# Patient Record
Sex: Female | Born: 1970
Health system: Southern US, Community
[De-identification: ages and names within clinical notes are randomized; demographics above are authoritative.]

## PROBLEM LIST (undated history)

## (undated) DIAGNOSIS — I2510 Atherosclerotic heart disease of native coronary artery without angina pectoris: Secondary | ICD-10-CM

## (undated) DIAGNOSIS — R002 Palpitations: Secondary | ICD-10-CM

## (undated) DIAGNOSIS — G894 Chronic pain syndrome: Secondary | ICD-10-CM

## (undated) DIAGNOSIS — R42 Dizziness and giddiness: Secondary | ICD-10-CM

## (undated) DIAGNOSIS — M791 Myalgia, unspecified site: Secondary | ICD-10-CM

## (undated) DIAGNOSIS — J449 Chronic obstructive pulmonary disease, unspecified: Secondary | ICD-10-CM

## (undated) DIAGNOSIS — R059 Cough, unspecified: Secondary | ICD-10-CM

## (undated) DIAGNOSIS — M62830 Muscle spasm of back: Secondary | ICD-10-CM

## (undated) DIAGNOSIS — I639 Cerebral infarction, unspecified: Secondary | ICD-10-CM

## (undated) DIAGNOSIS — L659 Nonscarring hair loss, unspecified: Secondary | ICD-10-CM

## (undated) DIAGNOSIS — K589 Irritable bowel syndrome without diarrhea: Secondary | ICD-10-CM

## (undated) DIAGNOSIS — K219 Gastro-esophageal reflux disease without esophagitis: Secondary | ICD-10-CM

## (undated) DIAGNOSIS — R413 Other amnesia: Secondary | ICD-10-CM

## (undated) DIAGNOSIS — E782 Mixed hyperlipidemia: Secondary | ICD-10-CM

## (undated) DIAGNOSIS — R0989 Other specified symptoms and signs involving the circulatory and respiratory systems: Secondary | ICD-10-CM

## (undated) DIAGNOSIS — C801 Malignant (primary) neoplasm, unspecified: Secondary | ICD-10-CM

## (undated) DIAGNOSIS — I4891 Unspecified atrial fibrillation: Secondary | ICD-10-CM

## (undated) DIAGNOSIS — R05 Cough: Secondary | ICD-10-CM

## (undated) HISTORY — DX: Cough, unspecified: R05.9

## (undated) HISTORY — DX: Irritable bowel syndrome, unspecified: K58.9

## (undated) HISTORY — DX: Nonscarring hair loss, unspecified: L65.9

## (undated) HISTORY — DX: Mixed hyperlipidemia: E78.2

## (undated) HISTORY — DX: Other specified symptoms and signs involving the circulatory and respiratory systems: R09.89

## (undated) HISTORY — DX: Dizziness and giddiness: R42

## (undated) HISTORY — PX: NASAL SINUS SURGERY: SHX719

## (undated) HISTORY — DX: Atherosclerotic heart disease of native coronary artery without angina pectoris: I25.10

## (undated) HISTORY — DX: Cerebral infarction, unspecified: I63.9

## (undated) HISTORY — PX: EYE SURGERY: SHX253

## (undated) HISTORY — DX: Muscle spasm of back: M62.830

## (undated) HISTORY — DX: Gastro-esophageal reflux disease without esophagitis: K21.9

## (undated) HISTORY — DX: Myalgia, unspecified site: M79.10

## (undated) HISTORY — DX: Cough: R05

## (undated) HISTORY — DX: Chronic obstructive pulmonary disease, unspecified: J44.9

## (undated) HISTORY — DX: Unspecified atrial fibrillation: I48.91

## (undated) HISTORY — DX: Palpitations: R00.2

## (undated) HISTORY — DX: Chronic pain syndrome: G89.4

## (undated) HISTORY — DX: Other amnesia: R41.3

## (undated) HISTORY — DX: Malignant (primary) neoplasm, unspecified: C80.1

---

## 2013-06-05 ENCOUNTER — Ambulatory Visit: Payer: Self-pay | Admitting: Podiatrist

## 2013-06-19 ENCOUNTER — Ambulatory Visit (INDEPENDENT_AMBULATORY_CARE_PROVIDER_SITE_OTHER): Payer: BC Managed Care – PPO | Admitting: Podiatrist

## 2013-06-19 ENCOUNTER — Encounter: Payer: Self-pay | Admitting: Podiatrist

## 2013-06-19 VITALS — BP 108/67 | HR 68 | Resp 18

## 2013-06-19 DIAGNOSIS — M799 Soft tissue disorder, unspecified: Secondary | ICD-10-CM

## 2013-06-19 NOTE — Patient Instructions (Signed)
ANTIBACTERIAL SOAP INSTRUCTIONS     Shower as usual. Before getting out, place a drop of antibacterial liquid soap (Dial) on a wet, clean washcloth.  Gently wipe washcloth over affected area.  Afterward, rinse the area with warm water.  Blot the area dry with a soft cloth and cover with antibiotic ointment (neosporin, polysporin, bacitracin) and band aid or gauze and tape       Corns and Calluses Corns are small areas of thickened skin that usually occur on the top, sides, or tip of a toe. They contain a cone-shaped core with a point that can press on a nerve below. This causes pain. Calluses are areas of thickened skin that usually develop on hands, fingers, palms, soles of the feet, and heels. These are areas that experience frequent friction or pressure. CAUSES  Corns are usually the result of rubbing (friction) or pressure from shoes that are too tight or do not fit properly. Calluses are caused by repeated friction and pressure on the affected areas. SYMPTOMS  A hard growth on the skin.  Pain or tenderness under the skin.  Sometimes, redness and swelling.  Increased discomfort while wearing tight-fitting shoes. DIAGNOSIS  Your caregiver can usually tell what the problem is by doing a physical exam. TREATMENT  Removing the cause of the friction or pressure is usually the only treatment needed. However, sometimes medicines can be used to help soften the hardened, thickened areas. These medicines include salicylic acid plasters and 12% ammonium lactate lotion. These medicines should only be used under the direction of your caregiver. HOME CARE INSTRUCTIONS   Try to remove pressure from the affected area.  You may wear donut-shaped corn pads to protect your skin.  You may use a pumice stone or nonmetallic nail file to gently reduce the thickness of a corn.  Wear properly fitted footwear.  If you have calluses on the hands, wear gloves during activities that cause  friction.  If you have diabetes, you should regularly examine your feet. Tell your caregiver if you notice any problems with your feet. SEEK IMMEDIATE MEDICAL CARE IF:   You have increased pain, swelling, redness, or warmth in the affected area.  Your corn or callus starts to drain fluid or bleeds.  You are not getting better, even with treatment. Document Released: 10/04/2003 Document Revised: 03/22/2011 Document Reviewed: 08/25/2010 [facility] Patient Information 2014 [place], [place].

## 2013-06-19 NOTE — Progress Notes (Signed)
   Subjective:    Patient ID: Sherri Arnold, female    DOB: [birth year], 43 y.o.   MRN: [id number]  HPI " I have a place on the bottomof my 3rd toe on my left foot and has been going on for about 2 years and I have been cutting them off for about 4 years and numbness and my feet tingle.  I had one on the other foot and I cut it off with a razor blade and it didn't come back.  "       Review of Systems  Respiratory: Positive for cough.   Gastrointestinal: Positive for constipation.       Bloating    Musculoskeletal:       Joint pain  All other systems reviewed and are negative.      Objective:   Physical Exam Patient is awake, alert, and oriented x 3.  In no acute distress.  Vascular status is intact with palpable pedal pulses at 2/4 DP and PT bilateral and capillary refill time within normal limits. Neurological sensation is also intact bilaterally via Semmes Weinstein monofilament at 5/5 sites. Light touch, vibratory sensation, Achilles tendon reflex is intact. Dermatological exam generally reveals skin color, turger and texture as normal. No open lesions present.  Musculature intact with dorsiflexion, plantarflexion, inversion, eversion.  Plantar left 3rd toe at the pulp of the proximal plantar portion of the toe has a large keratotic lesion present.  Pain with pressure is noted.  Central core is present.      Assessment & Plan:  Soft tissue lesion plantar 3rd left toe  Plan:  Patient requesting excision of lesion,  I discussed chance that it may recur with or without excision. Patient wishes to proceed.  The toe was anesthetized with 2% lidocaine plain and the lesion excised with a 15 blade.  Antibiotic ointment and a dressing was applied and patient given instructions for aftercare.  She will be seen back as needed or if any problems are to arise.

## 2016-01-12 DIAGNOSIS — R1013 Epigastric pain: Secondary | ICD-10-CM | POA: Diagnosis not present

## 2016-01-12 DIAGNOSIS — K2900 Acute gastritis without bleeding: Secondary | ICD-10-CM | POA: Diagnosis not present

## 2016-01-12 DIAGNOSIS — R1012 Left upper quadrant pain: Secondary | ICD-10-CM | POA: Diagnosis not present

## 2016-01-19 DIAGNOSIS — J019 Acute sinusitis, unspecified: Secondary | ICD-10-CM | POA: Diagnosis not present

## 2016-01-19 DIAGNOSIS — R1013 Epigastric pain: Secondary | ICD-10-CM | POA: Diagnosis not present

## 2016-01-19 DIAGNOSIS — R05 Cough: Secondary | ICD-10-CM | POA: Diagnosis not present

## 2016-03-02 DIAGNOSIS — I639 Cerebral infarction, unspecified: Secondary | ICD-10-CM | POA: Diagnosis not present

## 2016-03-02 DIAGNOSIS — I771 Stricture of artery: Secondary | ICD-10-CM | POA: Diagnosis not present

## 2016-03-02 DIAGNOSIS — E785 Hyperlipidemia, unspecified: Secondary | ICD-10-CM | POA: Diagnosis not present

## 2016-03-02 DIAGNOSIS — Z85828 Personal history of other malignant neoplasm of skin: Secondary | ICD-10-CM | POA: Diagnosis not present

## 2016-03-02 DIAGNOSIS — E7800 Pure hypercholesterolemia, unspecified: Secondary | ICD-10-CM | POA: Diagnosis not present

## 2016-03-02 DIAGNOSIS — R531 Weakness: Secondary | ICD-10-CM | POA: Diagnosis not present

## 2016-03-02 DIAGNOSIS — G459 Transient cerebral ischemic attack, unspecified: Secondary | ICD-10-CM | POA: Diagnosis not present

## 2016-03-03 DIAGNOSIS — G459 Transient cerebral ischemic attack, unspecified: Secondary | ICD-10-CM | POA: Diagnosis not present

## 2016-03-03 DIAGNOSIS — R938 Abnormal findings on diagnostic imaging of other specified body structures: Secondary | ICD-10-CM | POA: Diagnosis not present

## 2016-03-26 DIAGNOSIS — R109 Unspecified abdominal pain: Secondary | ICD-10-CM | POA: Diagnosis not present

## 2016-04-27 DIAGNOSIS — R1012 Left upper quadrant pain: Secondary | ICD-10-CM | POA: Diagnosis not present

## 2016-04-27 DIAGNOSIS — H6592 Unspecified nonsuppurative otitis media, left ear: Secondary | ICD-10-CM | POA: Diagnosis not present

## 2016-04-30 DIAGNOSIS — R109 Unspecified abdominal pain: Secondary | ICD-10-CM | POA: Diagnosis not present

## 2016-04-30 DIAGNOSIS — R1012 Left upper quadrant pain: Secondary | ICD-10-CM | POA: Diagnosis not present

## 2016-05-20 DIAGNOSIS — R1012 Left upper quadrant pain: Secondary | ICD-10-CM | POA: Diagnosis not present

## 2016-05-20 DIAGNOSIS — K5900 Constipation, unspecified: Secondary | ICD-10-CM | POA: Diagnosis not present

## 2016-05-21 DIAGNOSIS — R1012 Left upper quadrant pain: Secondary | ICD-10-CM | POA: Diagnosis not present

## 2016-05-21 DIAGNOSIS — K5900 Constipation, unspecified: Secondary | ICD-10-CM | POA: Diagnosis not present

## 2016-06-01 DIAGNOSIS — E785 Hyperlipidemia, unspecified: Secondary | ICD-10-CM | POA: Diagnosis not present

## 2016-06-01 DIAGNOSIS — Z0000 Encounter for general adult medical examination without abnormal findings: Secondary | ICD-10-CM | POA: Diagnosis not present

## 2016-06-01 DIAGNOSIS — I6502 Occlusion and stenosis of left vertebral artery: Secondary | ICD-10-CM | POA: Diagnosis not present

## 2016-06-01 DIAGNOSIS — G459 Transient cerebral ischemic attack, unspecified: Secondary | ICD-10-CM | POA: Diagnosis not present

## 2016-06-09 ENCOUNTER — Other Ambulatory Visit: Payer: Self-pay | Admitting: *Deleted

## 2016-06-09 DIAGNOSIS — I6523 Occlusion and stenosis of bilateral carotid arteries: Secondary | ICD-10-CM

## 2016-06-17 ENCOUNTER — Encounter: Payer: Self-pay | Admitting: Surgery

## 2016-06-22 ENCOUNTER — Ambulatory Visit (HOSPITAL_COMMUNITY)
Admission: RE | Admit: 2016-06-22 | Discharge: 2016-06-22 | Disposition: A | Discharge status: Home / Self Care | Payer: Commercial Managed Care - HMO | Source: Ambulatory Visit | Attending: Surgery | Admitting: Surgery

## 2016-06-22 DIAGNOSIS — I6523 Occlusion and stenosis of bilateral carotid arteries: Secondary | ICD-10-CM | POA: Diagnosis not present

## 2016-06-22 LAB — VAS US CAROTID
LCCADSYS: -90 cm/s
LEFT ECA DIAS: -45 cm/s
Left CCA dist dias: -34 cm/s
Left CCA prox dias: 22 cm/s
Left CCA prox sys: 72 cm/s
Left ICA dist dias: -48 cm/s
Left ICA dist sys: -108 cm/s
Left ICA prox dias: 25 cm/s
Left ICA prox sys: 67 cm/s
RCCAPDIAS: 24 cm/s
RCCAPSYS: 72 cm/s
RIGHT CCA MID DIAS: 23 cm/s
RIGHT ECA DIAS: -37 cm/s
RIGHT VERTEBRAL DIAS: -23 cm/s
Right cca dist sys: -60 cm/s

## 2016-06-23 ENCOUNTER — Encounter: Payer: Self-pay | Admitting: Surgery

## 2016-06-23 ENCOUNTER — Ambulatory Visit (INDEPENDENT_AMBULATORY_CARE_PROVIDER_SITE_OTHER): Payer: Commercial Managed Care - HMO | Admitting: Surgery

## 2016-06-23 VITALS — BP 102/71 | HR 96 | Temp 97.6°F | Resp 18 | Ht 63.0 in | Wt 143.0 lb

## 2016-06-23 DIAGNOSIS — I714 Abdominal aortic aneurysm, without rupture, unspecified: Secondary | ICD-10-CM

## 2016-06-23 DIAGNOSIS — I6523 Occlusion and stenosis of bilateral carotid arteries: Secondary | ICD-10-CM

## 2016-06-23 DIAGNOSIS — I6509 Occlusion and stenosis of unspecified vertebral artery: Secondary | ICD-10-CM

## 2016-06-23 NOTE — Progress Notes (Signed)
[facility]ist of [facility]  Patient name: Sherri Arnold MRN: [id number] DOB: [birth year] Sex: female   REQUESTING PROVIDER:    Dr. Marcello Moores   REASON FOR CONSULT:    carotid disease  HISTORY OF PRESENT ILLNESS:   Sherri Arnold is a 46 y.o. female, who is Referred today for evaluation of her carotid disease.  In February 2018 she experienced left arm and leg weakness.  She was taken to the hospital and underwent a full workup.  This revealed a left vertebral artery occlusion at its origin.  MRI was negative for stroke.  She left the hospital AMA  She denies having any other neurologic symptoms since that time with the exception of dizziness.  She states she can turn her head left or right and she can get dizzy.  She does not have weakness in either extremity with activity.  She does not get dizzy when raising her hands over her head.  Patient is now on a statin for hypercholesterolemia.  She continues to smoke but is cut back to 6-7 per day.  She does have a significant history of cardiovascular disease in her family.  Nobody has moved past the age of 40 and most people have died in her 80s.  PAST MEDICAL HISTORY    Past Medical History:  Diagnosis Date  . A-fib ([place])   . Cancer ([place])   . COPD (chronic obstructive pulmonary disease) ([place])   . Coronary artery disease   . Cough   . Dizziness   . GERD (gastroesophageal reflux disease)   . IBS (irritable bowel syndrome)   . Memory loss   . Muscle pain   . Poor circulation   . Stroke ([place])    mini-stroke     FAMILY HISTORY   Family History  Problem Relation Age of Onset  . Heart disease Paternal Grandfather     SOCIAL HISTORY:   Social History   Social History  . Marital status: Single    Spouse name: N/A  . Number of children: N/A  . Years of education: N/A   Occupational History  . Not on file.   Social History Main Topics  . Smoking status: Current  Every Day Smoker    Packs/day: 0.25    Types: Cigarettes  . Smokeless tobacco: Never Used  . Alcohol use No  . Drug use: No  . Sexual activity: Not on file   Other Topics Concern  . Not on file   Social History Narrative  . No narrative on file    ALLERGIES:    Allergies  Allergen Reactions  . Ciprofloxacin Hcl Swelling    CURRENT MEDICATIONS:    Current Outpatient Prescriptions  Medication Sig Dispense Refill  . pantoprazole (PROTONIX) 40 MG tablet Take 40 mg by mouth 2 (two) times daily.    . rosuvastatin (CRESTOR) 10 MG tablet Take 10 mg by mouth daily.    Marland Kitchen estrogens, conjugated, (PREMARIN) 1.25 MG tablet Take 1.25 mg by mouth daily.    Marland Kitchen FLUoxetine (PROZAC) 10 MG capsule Take 10 mg by mouth daily.    Marland Kitchen omeprazole (PRILOSEC) 40 MG capsule Take 40 mg by mouth daily.     No current facility-administered medications for this visit.     REVIEW OF SYSTEMS:   [X]  denotes positive finding, [ ]  denotes negative finding Cardiac  Comments:  Chest pain or chest pressure:    Shortness of breath upon exertion: x   Short of breath when lying  flat:    Irregular heart rhythm:        Vascular    Pain in calf, thigh, or hip brought on by ambulation:    Pain in feet at night that wakes you up from your sleep:     Blood clot in your veins:    Leg swelling:  x       Pulmonary    Oxygen at home:    Productive cough:  x   Wheezing:         Neurologic    Sudden weakness in arms or legs:  x   Sudden numbness in arms or legs:  x   Sudden onset of difficulty speaking or slurred speech:    Temporary loss of vision in one eye:  x   Problems with dizziness:  x       Gastrointestinal    Blood in stool:      Vomited blood:         Genitourinary    Burning when urinating:     Blood in urine:        Psychiatric    Major depression:         Hematologic    Bleeding problems:    Problems with blood clotting too easily:        Skin    Rashes or ulcers:          Constitutional    Fever or chills:     PHYSICAL EXAM:   There were no vitals filed for this visit.  GENERAL: The patient is a well-nourished female, in no acute distress. The vital signs are documented above. CARDIAC: There is a regular rate and rhythm.  VASCULAR: No carotid bruits identified.  She does have a cardiac murmur.  Palpable left and right radial pulses, the right is more prominent. PULMONARY: Nonlabored respirations ABDOMEN: Soft and non-tender.  No pulsatile mass  MUSCULOSKELETAL: There are no major deformities or cyanosis. NEUROLOGIC: No focal weakness or paresthesias are detected. SKIN: There are no ulcers or rashes noted. PSYCHIATRIC: The patient has a normal affect.  STUDIES:   I have reviewed her MRI of the head which was negative for acute events. Her CT angiogram shows a left vertebral artery occlusion at its origin.  There is a stenosis within the right P1 segment of the PCA.  She has bilateral external carotid stenosis. Ultrasound was performed today in our office which shows 1-39 percent bilateral stenosis.  ASSESSMENT and PLAN   The patient does not have any significant extracranial carotid stenosis.  She does have a left vertebral artery occlusion, however I believe she remains asymptomatic from this.  She does not have any clinical evidence of left subclavian steal syndrome.  No vascular intervention is recommended at this time.  She needs maximal medical therapy.  I stressed the importance of taking her statin as well as smoking cessation.  She will follow-up with me in 1 year with repeat carotid duplex.   Annamarie Major, MD [facility] of [facility] [phone] Pager [phone]

## 2016-07-01 NOTE — Addendum Note (Signed)
Addended by: Lianne Cure A on: 07/01/2016 10:12 AM   Modules accepted: Orders

## 2016-07-27 DIAGNOSIS — I493 Ventricular premature depolarization: Secondary | ICD-10-CM

## 2016-07-27 DIAGNOSIS — R079 Chest pain, unspecified: Secondary | ICD-10-CM

## 2016-07-27 HISTORY — DX: Chest pain, unspecified: R07.9

## 2016-07-27 HISTORY — DX: Ventricular premature depolarization: I49.3

## 2016-08-04 ENCOUNTER — Ambulatory Visit: Payer: Commercial Managed Care - HMO | Admitting: Cardiology

## 2016-09-17 DIAGNOSIS — K625 Hemorrhage of anus and rectum: Secondary | ICD-10-CM | POA: Diagnosis not present

## 2016-09-17 DIAGNOSIS — K581 Irritable bowel syndrome with constipation: Secondary | ICD-10-CM | POA: Diagnosis not present

## 2016-09-17 DIAGNOSIS — R1012 Left upper quadrant pain: Secondary | ICD-10-CM | POA: Diagnosis not present

## 2016-11-05 DIAGNOSIS — Z800 Family history of malignant neoplasm of digestive organs: Secondary | ICD-10-CM | POA: Diagnosis not present

## 2016-11-05 DIAGNOSIS — R1012 Left upper quadrant pain: Secondary | ICD-10-CM | POA: Diagnosis not present

## 2016-11-05 DIAGNOSIS — R1310 Dysphagia, unspecified: Secondary | ICD-10-CM | POA: Diagnosis not present

## 2016-11-05 DIAGNOSIS — K2900 Acute gastritis without bleeding: Secondary | ICD-10-CM | POA: Diagnosis not present

## 2016-11-05 DIAGNOSIS — K641 Second degree hemorrhoids: Secondary | ICD-10-CM | POA: Diagnosis not present

## 2016-11-05 DIAGNOSIS — D124 Benign neoplasm of descending colon: Secondary | ICD-10-CM | POA: Diagnosis not present

## 2016-11-05 DIAGNOSIS — R1013 Epigastric pain: Secondary | ICD-10-CM | POA: Diagnosis not present

## 2016-11-05 DIAGNOSIS — K625 Hemorrhage of anus and rectum: Secondary | ICD-10-CM | POA: Diagnosis not present

## 2016-12-16 DIAGNOSIS — R109 Unspecified abdominal pain: Secondary | ICD-10-CM | POA: Diagnosis not present

## 2016-12-20 DIAGNOSIS — R05 Cough: Secondary | ICD-10-CM | POA: Diagnosis not present

## 2016-12-20 DIAGNOSIS — B349 Viral infection, unspecified: Secondary | ICD-10-CM | POA: Diagnosis not present

## 2017-01-19 DIAGNOSIS — R52 Pain, unspecified: Secondary | ICD-10-CM | POA: Diagnosis not present

## 2017-01-21 DIAGNOSIS — G459 Transient cerebral ischemic attack, unspecified: Secondary | ICD-10-CM | POA: Diagnosis not present

## 2017-01-21 DIAGNOSIS — E785 Hyperlipidemia, unspecified: Secondary | ICD-10-CM | POA: Diagnosis not present

## 2017-01-21 DIAGNOSIS — Z79899 Other long term (current) drug therapy: Secondary | ICD-10-CM | POA: Diagnosis not present

## 2017-01-21 DIAGNOSIS — I6502 Occlusion and stenosis of left vertebral artery: Secondary | ICD-10-CM | POA: Diagnosis not present

## 2017-02-01 ENCOUNTER — Ambulatory Visit: Payer: 59 | Admitting: Allergy

## 2017-02-01 ENCOUNTER — Encounter: Payer: Self-pay | Admitting: Allergy

## 2017-02-01 ENCOUNTER — Other Ambulatory Visit: Payer: Self-pay

## 2017-02-01 VITALS — BP 112/74 | HR 84 | Temp 98.9°F | Resp 18 | Ht 63.0 in | Wt 144.8 lb

## 2017-02-01 DIAGNOSIS — J4540 Moderate persistent asthma, uncomplicated: Secondary | ICD-10-CM

## 2017-02-01 DIAGNOSIS — J309 Allergic rhinitis, unspecified: Secondary | ICD-10-CM

## 2017-02-01 DIAGNOSIS — T7819XA Other adverse food reactions, not elsewhere classified, initial encounter: Secondary | ICD-10-CM

## 2017-02-01 DIAGNOSIS — H1010 Acute atopic conjunctivitis, unspecified eye: Secondary | ICD-10-CM

## 2017-02-01 DIAGNOSIS — T781XXA Other adverse food reactions, not elsewhere classified, initial encounter: Secondary | ICD-10-CM

## 2017-02-01 MED ORDER — FLUTICASONE FUROATE 100 MCG/ACT IN AEPB: 1.0000 | INHALATION_SPRAY | Freq: Every day | RESPIRATORY_TRACT | 3 refills | Status: DC

## 2017-02-01 MED ORDER — ALBUTEROL SULFATE (2.5 MG/3ML) 0.083% IN NEBU: 2.5000 mg | INHALATION_SOLUTION | RESPIRATORY_TRACT | 1 refills | Status: DC | PRN

## 2017-02-01 MED ORDER — OLOPATADINE HCL 0.2 % OP SOLN: 1.0000 [drp] | OPHTHALMIC | 5 refills | Status: DC

## 2017-02-01 MED ORDER — IPRATROPIUM-ALBUTEROL 0.5-2.5 (3) MG/3ML IN SOLN: 3.0000 mL | Freq: Four times a day (QID) | RESPIRATORY_TRACT | Status: AC

## 2017-02-01 NOTE — Patient Instructions (Addendum)
Allergies    - environmental allergy skin testing today is positive to ragweed, dust mites and dog    - allergen avoidance measures discussed/provided today    - recommend using ZyrtecD only during illnesses like sinus infections.      - for daily allergy symptoms control try Xyzal 5mg  daily    - for nasal congestion use OTC Flonase, Nasacort or Rhinocort 2 sprays each nostril daily.  Use for 1-2 weeks at a time before stopping once symptoms improved.      - for itchy/watery/red eyes use Pataday 1 drop each eye as needed daily    - allergen immunotherapy (allergy shots) is a potential treatment option for allergy symptoms control and protocol, benefits and risks discussed today.  Will need your breathing to be under good control prior to starting allergy shots if you would like to proceed with this option.    Breathing issues    - lung function is reduced but did improve greatly after albuterol use by nebulizer    - for maintenance of symptoms recommend using Arnuity 1 puff daily    - have access to albuterol inhaler 2 puffs every 4-6 hours as needed for cough/wheeze/shortness of breath/chest tightness.  May use 15-20 minutes prior to activity.   Monitor frequency of use.    Control goals:   Full participation in all desired activities (may need albuterol before activity)  Albuterol use two time or less a week on average (not counting use with activity)  Cough interfering with sleep two time or less a month  Oral steroids no more than once a year  No hospitalizations  Adverse food reaction    -  Skin testing to 10 common food allergen is negative.  Thus you do not have IgE mediated food allergy however you may have intolerances.  Follow-up 3-4 months or sooner if needed

## 2017-02-01 NOTE — Progress Notes (Signed)
New Patient Note  RE: Sherri Arnold MRN: [id number] DOB: [birth year] Date of Office Visit: 02/01/2017  Referring provider: Melony Overly, MD Primary care provider: Melony Overly, MD  Chief Complaint: Allergies and breathing difficulties  History of present illness: Sherri Arnold is a 47 y.o. female presenting today for consultation for allergies and breathing difficulties.    She states she has sinus and throat issues.  She has nasal congestion, sinus pressure through her cheeks, HA, throat clearing, sneezing and itchy/watery eyes.   Symptoms are worse during winter. She states in the summertime her symptoms are non-existence and she does not need to take any medications during the summer.  She takes zyrtecD which she states works well for symptom control.  She has been on zyrtecD for couple years now.  She has taken plain zyrtec and states does not help.  She has tried Afrin for her nasal congestion.  She has never used any eye drops.  She has had allergy testing done via blood work that she recalls being allergic to dust.  She reports sinus infections with need for antibiotic about 2 times a year.     She has ventolin inhaler that she has had now for the past year.  She states she was prescribed albuterol due to having a "chest cold" a year or so ago.  She reports using albuterol about once month.  She also has a nebulizer that she uses about once a month as well.    She will use albuterol for shortness of breath, wheezing and chest tightness.  Albuterol does help to relieve symptoms.  She reports last prednisone use was about couple weeks ago due to sinus and breathing issues.  She denies any nighttime awakenings.  She denies any hospitalizations.    She also has stomach issues.  She reports after she eats she gets left sided pain, bloating and gas as well a nausea without emesis.  She also states she has developed a itchy rash on her stomach and back with the abdominal  symptoms.  She has not been able to identify any particular foods that trigger these symptoms.  Symptoms can occur about once a week.  Symptoms ongoing now since Dec 2017.  She has had colonoscopy that was negative.    No history of eczema.    Review of systems: Review of Systems  Constitutional: Negative for chills, fever and malaise/fatigue.  HENT: Positive for congestion and sinus pain. Negative for ear discharge, ear pain, nosebleeds and sore throat.   Eyes: Positive for redness. Negative for pain and discharge.  Respiratory: Positive for cough, shortness of breath and wheezing.   Cardiovascular: Negative for chest pain.  Gastrointestinal: Positive for abdominal pain and nausea. Negative for constipation, diarrhea, heartburn and vomiting.  Musculoskeletal: Negative for joint pain and myalgias.  Skin: Negative for itching and rash.  Neurological: Positive for headaches. Negative for dizziness.    All other systems negative unless noted above in HPI  Past medical history: Past Medical History:  Diagnosis Date  . A-fib ([place])   . Cancer ([place])   . COPD (chronic obstructive pulmonary disease) ([place])   . Coronary artery disease   . Cough   . Dizziness   . GERD (gastroesophageal reflux disease)   . IBS (irritable bowel syndrome)   . Memory loss   . Muscle pain   . Poor circulation   . Stroke [facility])    mini-stroke    Past surgical history: Past  Surgical History:  Procedure Laterality Date  . EYE SURGERY    . NASAL SINUS SURGERY      Family history:  Family History  Problem Relation Age of Onset  . Heart disease Paternal Grandfather   . COPD Mother   . Diabetes Maternal Grandmother   . Diabetes Paternal Grandmother     Social history: She lives in a home without carpeting with electric heating and central cooling.  There are no pets in the home.  There is no concern for water damage, mildew or roaches in the home.  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 Tobacco Use  .  Smoking status: Current Every Day Smoker    Packs/day: 0.25    Years: 33.00    Pack years: 8.25    Types: Cigarettes  . Smokeless tobacco: Never Used    Medication List: Allergies as of 02/01/2017      Reactions   Ciprofloxacin Hcl Swelling      Medication List        Accurate as of 02/01/17 12:31 PM. Always use your most recent med list.          cetirizine-pseudoephedrine 5-120 MG tablet Commonly known as:  ZYRTEC-D Take 1 tablet by mouth 2 (two) times daily.   estrogens (conjugated) 1.25 MG tablet Commonly known as:  PREMARIN Take 1.25 mg by mouth daily.   GOODSENSE ASPIRIN 325 MG tablet Generic drug:  aspirin Take 325 mg by mouth daily.   rosuvastatin 10 MG tablet Commonly known as:  CRESTOR Take 10 mg by mouth daily.   VENTOLIN HFA 108 (90 Base) MCG/ACT inhaler Generic drug:  albuterol       Known medication allergies: Allergies  Allergen Reactions  . Ciprofloxacin Hcl Swelling     Physical examination: Blood pressure 112/74, pulse 84, temperature 98.9 F (37.2 C), temperature source Oral, resp. rate 18, height 5\' 3"  (1.6 m), weight 144 lb 12.8 oz (65.7 kg).  General: Alert, interactive, in no acute distress. HEENT: PERRLA, TMs pearly gray, turbinates moderately edematous with clear discharge, post-pharynx non erythematous. Neck: Supple without lymphadenopathy. Lungs: Mildly decreased breath sounds bilaterally without wheezing, rhonchi or rales. {no increased work of breathing.  Improved aeration status post DuoNeb CV: Normal S1, S2 without murmurs. Abdomen: Nondistended, nontender. Skin: Warm and dry, without lesions or rashes. Extremities:  No clubbing, cyanosis or edema. Neuro:   Grossly intact.  Diagnositics/Labs:  Spirometry: FEV1: 1.34L 48%, FVC: 2.65L  76%.  After DuoNeb she had a significant increase of 26% in FEV1 to 1.69 L or 61% of predicted.  Allergy testing: Environmental allergy skin prick testing is positive to short and giant  ragweed and dust mites Intradermal testing is positive to dog Allergy testing results were read and interpreted by provider, documented by clinical staff.   Assessment and plan:   Allergic rhinoconjunctivitis    - environmental allergy skin testing today is positive to ragweed, dust mites and dog    - allergen avoidance measures discussed/provided today    - recommend using ZyrtecD only during illnesses like sinus infections.      - for daily allergy symptom control try Xyzal 5mg  daily    - for nasal congestion use OTC Flonase, Nasacort or Rhinocort 2 sprays each nostril daily.  Use for 1-2 weeks at a time before stopping once symptoms improved.      - for itchy/watery/red eyes use Pataday 1 drop each eye as needed daily    - allergen immunotherapy (allergy shots) is  a potential treatment option for allergy symptoms control and protocol, benefits and risks discussed today.  Will need your breathing to be under good control prior to starting allergy shots if you would like to proceed with this option.    Asthma, moderate persistent    - lung function is reduced but did improve greatly after albuterol use    - for maintenance of symptoms recommend using Arnuity 1 puff daily    - have access to albuterol inhaler 2 puffs every 4-6 hours as needed for cough/wheeze/shortness of breath/chest tightness.  May use 15-20 minutes prior to activity.   Monitor frequency of use.    Control goals:   Full participation in all desired activities (may need albuterol before activity)  Albuterol use two time or less a week on average (not counting use with activity)  Cough interfering with sleep two time or less a month  Oral steroids no more than once a year  No hospitalizations  Adverse food reaction    -  Skin testing to 10 common food allergens is negative.  Thus you do not have IgE mediated food allergy however you may have intolerances.  I have advised her if she is able to identify any particular  foods to remove them from the diet and see if her symptoms improve.  Follow-up 3-4 months or sooner if needed   I appreciate the opportunity to take part in Sima's care. Please do not hesitate to contact me with questions.  Sincerely,   Prudy Feeler, MD Allergy/Immunology [facility] [place]

## 2017-02-03 ENCOUNTER — Telehealth: Payer: Self-pay | Admitting: Surgery

## 2017-02-03 NOTE — Telephone Encounter (Signed)
Anderson Malta from [facility] left a message to see if we had sched this pt from the referral they had sent. There is no current referral in the system to [place] for this pt.  However, [facility] sent [place] a referral back in June 2018. We saw the pt then and the pt will return to the office June 2019 for follow up.  Called Anderson Malta back at [phone] option 2 and left her a message giving her that information.

## 2017-02-10 DIAGNOSIS — J029 Acute pharyngitis, unspecified: Secondary | ICD-10-CM | POA: Diagnosis not present

## 2017-02-10 DIAGNOSIS — N3001 Acute cystitis with hematuria: Secondary | ICD-10-CM | POA: Diagnosis not present

## 2017-02-10 DIAGNOSIS — R05 Cough: Secondary | ICD-10-CM | POA: Diagnosis not present

## 2017-02-11 ENCOUNTER — Other Ambulatory Visit: Payer: Self-pay | Admitting: *Deleted

## 2017-02-11 MED ORDER — OLOPATADINE HCL 0.1 % OP SOLN: 1.0000 [drp] | Freq: Two times a day (BID) | OPHTHALMIC | 4 refills | Status: DC

## 2017-03-24 DIAGNOSIS — I6523 Occlusion and stenosis of bilateral carotid arteries: Secondary | ICD-10-CM

## 2017-03-24 DIAGNOSIS — I6509 Occlusion and stenosis of unspecified vertebral artery: Secondary | ICD-10-CM

## 2017-03-24 DIAGNOSIS — I708 Atherosclerosis of other arteries: Secondary | ICD-10-CM | POA: Insufficient documentation

## 2017-03-24 HISTORY — DX: Occlusion and stenosis of unspecified vertebral artery: I65.09

## 2017-03-24 HISTORY — DX: Atherosclerosis of other arteries: I70.8

## 2017-03-24 HISTORY — DX: Occlusion and stenosis of bilateral carotid arteries: I65.23

## 2017-04-12 DIAGNOSIS — R202 Paresthesia of skin: Secondary | ICD-10-CM | POA: Diagnosis not present

## 2017-04-12 DIAGNOSIS — R42 Dizziness and giddiness: Secondary | ICD-10-CM | POA: Diagnosis not present

## 2017-04-12 DIAGNOSIS — R079 Chest pain, unspecified: Secondary | ICD-10-CM | POA: Diagnosis not present

## 2017-04-12 DIAGNOSIS — R51 Headache: Secondary | ICD-10-CM | POA: Diagnosis not present

## 2017-04-12 DIAGNOSIS — R200 Anesthesia of skin: Secondary | ICD-10-CM | POA: Diagnosis not present

## 2017-04-12 DIAGNOSIS — G459 Transient cerebral ischemic attack, unspecified: Secondary | ICD-10-CM | POA: Diagnosis not present

## 2017-04-13 DIAGNOSIS — R079 Chest pain, unspecified: Secondary | ICD-10-CM | POA: Diagnosis not present

## 2017-04-15 DIAGNOSIS — M542 Cervicalgia: Secondary | ICD-10-CM | POA: Diagnosis not present

## 2017-04-15 DIAGNOSIS — M25511 Pain in right shoulder: Secondary | ICD-10-CM | POA: Diagnosis not present

## 2017-04-19 DIAGNOSIS — G459 Transient cerebral ischemic attack, unspecified: Secondary | ICD-10-CM | POA: Diagnosis not present

## 2017-04-19 DIAGNOSIS — Z87891 Personal history of nicotine dependence: Secondary | ICD-10-CM | POA: Diagnosis not present

## 2017-04-19 DIAGNOSIS — J309 Allergic rhinitis, unspecified: Secondary | ICD-10-CM | POA: Diagnosis not present

## 2017-04-19 DIAGNOSIS — Z0001 Encounter for general adult medical examination with abnormal findings: Secondary | ICD-10-CM | POA: Diagnosis not present

## 2017-04-19 DIAGNOSIS — G478 Other sleep disorders: Secondary | ICD-10-CM | POA: Diagnosis not present

## 2017-04-19 DIAGNOSIS — E785 Hyperlipidemia, unspecified: Secondary | ICD-10-CM | POA: Diagnosis not present

## 2017-04-19 DIAGNOSIS — R319 Hematuria, unspecified: Secondary | ICD-10-CM | POA: Diagnosis not present

## 2017-05-20 DIAGNOSIS — E559 Vitamin D deficiency, unspecified: Secondary | ICD-10-CM | POA: Diagnosis not present

## 2017-05-20 DIAGNOSIS — J4540 Moderate persistent asthma, uncomplicated: Secondary | ICD-10-CM | POA: Diagnosis not present

## 2017-05-20 DIAGNOSIS — G4733 Obstructive sleep apnea (adult) (pediatric): Secondary | ICD-10-CM | POA: Diagnosis not present

## 2017-05-20 DIAGNOSIS — F17210 Nicotine dependence, cigarettes, uncomplicated: Secondary | ICD-10-CM | POA: Diagnosis not present

## 2017-05-31 ENCOUNTER — Ambulatory Visit: Payer: 59 | Admitting: Allergy

## 2017-06-10 DIAGNOSIS — R1013 Epigastric pain: Secondary | ICD-10-CM | POA: Diagnosis not present

## 2017-06-10 DIAGNOSIS — S93492S Sprain of other ligament of left ankle, sequela: Secondary | ICD-10-CM | POA: Diagnosis not present

## 2017-06-17 ENCOUNTER — Other Ambulatory Visit: Payer: Self-pay | Admitting: Orthopedic Surgery

## 2017-06-17 DIAGNOSIS — S93492A Sprain of other ligament of left ankle, initial encounter: Secondary | ICD-10-CM

## 2017-06-20 ENCOUNTER — Other Ambulatory Visit: Payer: Commercial Managed Care - HMO

## 2017-06-20 ENCOUNTER — Inpatient Hospital Stay
Admission: RE | Admit: 2017-06-20 | Discharge: 2017-06-20 | Disposition: A | Discharge status: Home / Self Care | Payer: Commercial Managed Care - HMO | Source: Ambulatory Visit | Attending: Orthopedic Surgery | Admitting: Orthopedic Surgery

## 2017-06-27 ENCOUNTER — Other Ambulatory Visit (HOSPITAL_COMMUNITY): Payer: Commercial Managed Care - HMO

## 2017-06-27 ENCOUNTER — Ambulatory Visit: Payer: Commercial Managed Care - HMO | Admitting: Surgery

## 2017-06-27 ENCOUNTER — Encounter (HOSPITAL_COMMUNITY): Payer: Commercial Managed Care - HMO

## 2017-06-29 DIAGNOSIS — G4733 Obstructive sleep apnea (adult) (pediatric): Secondary | ICD-10-CM | POA: Diagnosis not present

## 2017-07-04 ENCOUNTER — Ambulatory Visit
Admission: RE | Admit: 2017-07-04 | Discharge: 2017-07-04 | Disposition: A | Discharge status: Home / Self Care | Payer: Worker's Compensation | Source: Ambulatory Visit | Attending: Orthopedic Surgery | Admitting: Orthopedic Surgery

## 2017-07-04 DIAGNOSIS — S93492A Sprain of other ligament of left ankle, initial encounter: Secondary | ICD-10-CM

## 2017-07-12 DIAGNOSIS — J301 Allergic rhinitis due to pollen: Secondary | ICD-10-CM | POA: Diagnosis not present

## 2017-07-12 DIAGNOSIS — R5383 Other fatigue: Secondary | ICD-10-CM | POA: Diagnosis not present

## 2017-07-12 DIAGNOSIS — G4733 Obstructive sleep apnea (adult) (pediatric): Secondary | ICD-10-CM | POA: Diagnosis not present

## 2017-07-12 DIAGNOSIS — F17210 Nicotine dependence, cigarettes, uncomplicated: Secondary | ICD-10-CM | POA: Diagnosis not present

## 2017-07-12 DIAGNOSIS — J4540 Moderate persistent asthma, uncomplicated: Secondary | ICD-10-CM | POA: Diagnosis not present

## 2017-08-19 DIAGNOSIS — J018 Other acute sinusitis: Secondary | ICD-10-CM | POA: Diagnosis not present

## 2017-08-28 DIAGNOSIS — H9209 Otalgia, unspecified ear: Secondary | ICD-10-CM | POA: Diagnosis not present

## 2017-08-28 DIAGNOSIS — M542 Cervicalgia: Secondary | ICD-10-CM | POA: Diagnosis not present

## 2017-08-28 DIAGNOSIS — M25511 Pain in right shoulder: Secondary | ICD-10-CM | POA: Diagnosis not present

## 2017-10-07 DIAGNOSIS — M542 Cervicalgia: Secondary | ICD-10-CM | POA: Diagnosis not present

## 2017-10-07 DIAGNOSIS — R51 Headache: Secondary | ICD-10-CM | POA: Diagnosis not present

## 2017-10-12 DIAGNOSIS — M5412 Radiculopathy, cervical region: Secondary | ICD-10-CM | POA: Diagnosis not present

## 2017-10-12 DIAGNOSIS — G5601 Carpal tunnel syndrome, right upper limb: Secondary | ICD-10-CM | POA: Diagnosis not present

## 2017-10-19 DIAGNOSIS — R091 Pleurisy: Secondary | ICD-10-CM | POA: Diagnosis not present

## 2017-10-19 DIAGNOSIS — J209 Acute bronchitis, unspecified: Secondary | ICD-10-CM | POA: Diagnosis not present

## 2017-10-19 DIAGNOSIS — J0100 Acute maxillary sinusitis, unspecified: Secondary | ICD-10-CM | POA: Diagnosis not present

## 2017-10-25 DIAGNOSIS — J209 Acute bronchitis, unspecified: Secondary | ICD-10-CM | POA: Diagnosis not present

## 2017-10-25 DIAGNOSIS — M50122 Cervical disc disorder at C5-C6 level with radiculopathy: Secondary | ICD-10-CM | POA: Diagnosis not present

## 2017-10-26 DIAGNOSIS — K1120 Sialoadenitis, unspecified: Secondary | ICD-10-CM | POA: Diagnosis not present

## 2017-11-08 DIAGNOSIS — R51 Headache: Secondary | ICD-10-CM | POA: Diagnosis not present

## 2017-11-08 DIAGNOSIS — M50122 Cervical disc disorder at C5-C6 level with radiculopathy: Secondary | ICD-10-CM | POA: Diagnosis not present

## 2017-11-17 DIAGNOSIS — M5412 Radiculopathy, cervical region: Secondary | ICD-10-CM | POA: Diagnosis not present

## 2017-11-17 DIAGNOSIS — R0602 Shortness of breath: Secondary | ICD-10-CM | POA: Diagnosis not present

## 2017-11-24 DIAGNOSIS — M5031 Other cervical disc degeneration,  high cervical region: Secondary | ICD-10-CM | POA: Diagnosis not present

## 2017-11-24 DIAGNOSIS — G44209 Tension-type headache, unspecified, not intractable: Secondary | ICD-10-CM | POA: Diagnosis not present

## 2017-11-24 DIAGNOSIS — R51 Headache: Secondary | ICD-10-CM | POA: Diagnosis not present

## 2017-11-24 DIAGNOSIS — S161XXA Strain of muscle, fascia and tendon at neck level, initial encounter: Secondary | ICD-10-CM | POA: Diagnosis not present

## 2017-11-24 DIAGNOSIS — M542 Cervicalgia: Secondary | ICD-10-CM | POA: Diagnosis not present

## 2017-12-01 DIAGNOSIS — M50122 Cervical disc disorder at C5-C6 level with radiculopathy: Secondary | ICD-10-CM | POA: Diagnosis not present

## 2017-12-01 DIAGNOSIS — M542 Cervicalgia: Secondary | ICD-10-CM | POA: Diagnosis not present

## 2017-12-05 DIAGNOSIS — M50221 Other cervical disc displacement at C4-C5 level: Secondary | ICD-10-CM | POA: Diagnosis not present

## 2017-12-05 DIAGNOSIS — M5011 Cervical disc disorder with radiculopathy,  high cervical region: Secondary | ICD-10-CM | POA: Diagnosis not present

## 2017-12-05 DIAGNOSIS — M4802 Spinal stenosis, cervical region: Secondary | ICD-10-CM | POA: Diagnosis not present

## 2017-12-21 DIAGNOSIS — R202 Paresthesia of skin: Secondary | ICD-10-CM | POA: Diagnosis not present

## 2017-12-21 DIAGNOSIS — R531 Weakness: Secondary | ICD-10-CM | POA: Diagnosis not present

## 2017-12-30 DIAGNOSIS — M47812 Spondylosis without myelopathy or radiculopathy, cervical region: Secondary | ICD-10-CM | POA: Diagnosis not present

## 2017-12-30 DIAGNOSIS — R51 Headache: Secondary | ICD-10-CM | POA: Diagnosis not present

## 2017-12-30 DIAGNOSIS — J439 Emphysema, unspecified: Secondary | ICD-10-CM | POA: Diagnosis not present

## 2017-12-30 DIAGNOSIS — L040 Acute lymphadenitis of face, head and neck: Secondary | ICD-10-CM | POA: Diagnosis not present

## 2017-12-30 DIAGNOSIS — R221 Localized swelling, mass and lump, neck: Secondary | ICD-10-CM | POA: Diagnosis not present

## 2018-02-23 ENCOUNTER — Ambulatory Visit (INDEPENDENT_AMBULATORY_CARE_PROVIDER_SITE_OTHER): Payer: Worker's Compensation | Admitting: Sports Medicine

## 2018-02-23 ENCOUNTER — Encounter: Payer: Self-pay | Admitting: Sports Medicine

## 2018-02-23 ENCOUNTER — Ambulatory Visit: Payer: Self-pay

## 2018-02-23 ENCOUNTER — Other Ambulatory Visit: Payer: Self-pay

## 2018-02-23 DIAGNOSIS — M25572 Pain in left ankle and joints of left foot: Secondary | ICD-10-CM

## 2018-02-23 DIAGNOSIS — S93432A Sprain of tibiofibular ligament of left ankle, initial encounter: Secondary | ICD-10-CM

## 2018-02-23 DIAGNOSIS — S99912A Unspecified injury of left ankle, initial encounter: Secondary | ICD-10-CM

## 2018-02-23 NOTE — Progress Notes (Addendum)
Subjective:  LEVAEH VICE is a 48 y.o. female patient who presents to office for evaluation of left ankle pain. Patient complains of continued pain in the ankle after a injury that happened while at work on April 16, 2017 states that she had her heel caught in a hole and fell down states that every since she went to urgent care and then was referred to orthopedics and saw Dr. Lorin Mercy Dr. Lorin Mercy did a MRI that identified a ligament tear and advised patient that it would get better on its own however because it did not patient referred herself to [facility] and was seen by the doctor there who recommended physical therapy due to her continued nerve tingling and burning and pain to the extremity a nerve study was done where she said that it was identified that she has nerve damage.  Patient states the only thing that has helped is her cam boot. Patient has tried also Voltaren gel with no relief in symptoms. Patient denies any other pedal complaints.  Review of Systems  Musculoskeletal: Positive for joint pain.  Neurological: Positive for sensory change.  All other systems reviewed and are negative.    Patient Active Problem List   Diagnosis Date Noted  . Bilateral carotid artery stenosis 03/24/2017  . Left subclavian artery occlusion 03/24/2017  . Occlusion of vertebral artery 03/24/2017  . Chest pain 07/27/2016  . Ventricular premature beats 07/27/2016    Current Outpatient Medications on File Prior to Visit  Medication Sig Dispense Refill  . albuterol (PROVENTIL) (2.5 MG/3ML) 0.083% nebulizer solution Take 3 mLs (2.5 mg total) by nebulization every 4 (four) hours as needed for wheezing or shortness of breath. 75 mL 1  . aspirin (GOODSENSE ASPIRIN) 325 MG tablet Take 325 mg by mouth daily.    . cetirizine-pseudoephedrine (ZYRTEC-D) 5-120 MG tablet Take 1 tablet by mouth 2 (two) times daily.     Marland Kitchen estrogens, conjugated, (PREMARIN) 1.25 MG tablet Take 1.25 mg by mouth daily.    . Fluticasone  Furoate (ARNUITY ELLIPTA) 100 MCG/ACT AEPB Inhale 1 Dose into the lungs daily. Rinse, gargle, and spit after use. 1 each 3  . olopatadine (PATANOL) 0.1 % ophthalmic solution Place 1 drop into both eyes 2 (two) times daily. 5 mL 4  . rosuvastatin (CRESTOR) 10 MG tablet Take 10 mg by mouth daily.    . VENTOLIN HFA 108 (90 Base) MCG/ACT inhaler      Current Facility-Administered Medications on File Prior to Visit  Medication Dose Route Frequency Provider Last Rate Last Dose  . ipratropium-albuterol (DUONEB) 0.5-2.5 (3) MG/3ML nebulizer solution 3 mL  3 mL Nebulization Q6H Padgett, Rae Halsted, MD        Allergies  Allergen Reactions  . Ciprofloxacin Hcl Swelling  . Propofol Other (See Comments)    Objective:  General: Alert and oriented x3 in no acute distress  Dermatology: No open lesions bilateral lower extremities, no webspace macerations, no ecchymosis bilateral, all nails x 10 are well manicured.  Vascular: Dorsalis Pedis and Posterior Tibial pedal pulses faintly palpable, Capillary Fill Time 3 seconds,(+) pedal hair growth bilateral, no edema bilateral lower extremities, Temperature gradient within normal limits.  Neurology: Gross sensation intact via light touch bilateral, however there is subjective burning pain sharp shooting positive the low side on left over the dorsal cutaneous nerve.  Musculoskeletal: Mild tenderness with palpation at lateral ankle and tib-fib syndesmosis on left. Negative talar tilt, mild pain with tib-fib stress, mild left ankle instability on left.  No pain with calf compression bilateral. Range of motion within normal limits with mild guarding on left ankle. Strength within normal limits in all groups bilateral.   Gait: Antalgic gait  Xrays  Left Ankle   Impression: No acute findings.  Assessment and Plan: Problem List Items Addressed This Visit    None    Visit Diagnoses    Injury of left ankle, initial encounter    -  Primary   Relevant  Orders   DG Ankle Complete Left   Sprain of tibiofibular ligament of left ankle, initial encounter       Left ankle pain, unspecified chronicity           -Complete examination performed -Prior to patient appointment extensively spent time reviewing over 30 pages of faxed information from previous doctor visit MRI and progress reports sent and patient referral packet as this is a work comp case.  My time spent reviewing the charts were very extensive in preparation for patient visit. -Xrays were obtained today in office and reviewed -Discussed treatement options for continued pain after injury at work with suspected high ankle sprain/ligament tear -Rx repeat MRI for further evaluation of extent of injury since last MRI was over a year old with still very painful symptoms out of proportion -Advised patient to continue with Lyrica -Recommend to continue with rest elevation topical pain creams and rubs and cam boot -Patient to remain out of work until after her MRIs performed to prevent any further injury -Patient to return to office after MRI or sooner if condition worsens.  Landis Martins, DPM

## 2018-02-24 ENCOUNTER — Telehealth: Payer: Self-pay | Admitting: *Deleted

## 2018-02-24 DIAGNOSIS — M25572 Pain in left ankle and joints of left foot: Secondary | ICD-10-CM

## 2018-02-24 DIAGNOSIS — S93432A Sprain of tibiofibular ligament of left ankle, initial encounter: Secondary | ICD-10-CM

## 2018-02-24 DIAGNOSIS — S99912A Unspecified injury of left ankle, initial encounter: Secondary | ICD-10-CM

## 2018-02-24 NOTE — Telephone Encounter (Signed)
Faxed [facility] form with orders, clinicals and demographics to Melody.

## 2018-02-24 NOTE — Telephone Encounter (Signed)
-----   Message from Landis Martins, [place] sent at 02/23/2018  4:59 PM EST ----- Regarding: MRI Left ankle Work Comp Eval extent of Left ankle ligament tear DOI 04/2017

## 2018-03-16 ENCOUNTER — Telehealth: Payer: Self-pay | Admitting: *Deleted

## 2018-03-16 NOTE — Telephone Encounter (Signed)
We can send a letter stating that "the torn tendon/ligament is a part of the original injury as indicated on previous MRI. There is concern do to failure of conservative care that this tear has worsened. An updated MRI is necessary for surgical planning." Dr. Cannon Kettle

## 2018-03-16 NOTE — Telephone Encounter (Signed)
Patient came in stating the Dept of [employer] is refusing to cover the MRI because they do not have proof the torn tendon is part of the original injury per the first MRI they are requiring a letter from you stating that it is or they will not cover the surgery.  Please advise.

## 2018-03-23 NOTE — Telephone Encounter (Signed)
Called [employer] case examiner Geni Bers D left another message with return contact information requesting assistance in getting MRI approved for surgical planning gave Val O contact information as back up as I will be out of the office tomorrow  Tried to contact patient and letter her know I am still trying to get in touch with case examiner but voicemail was full.

## 2018-03-23 NOTE — Telephone Encounter (Signed)
Spoke with patient letter was not enough to get MRI authorized patient spoke with her claim examiner who stated she would help in what ever way possible to get it approved.  Patient gave phone number [phone]  Case number [id number]  Called and left message for Sherri Arnold claim examiner stating name phone number patients name and case number and need for assistance in getting MRI approved.  Will wait for return call

## 2018-03-29 NOTE — Telephone Encounter (Signed)
Spoke with case worker she faxed the prior authorization form Dr Cannon Kettle assisted in filling it out and it has been faxed back with required office notes.

## 2018-04-28 ENCOUNTER — Ambulatory Visit (INDEPENDENT_AMBULATORY_CARE_PROVIDER_SITE_OTHER): Payer: Worker's Compensation | Admitting: Sports Medicine

## 2018-04-28 ENCOUNTER — Other Ambulatory Visit: Payer: Self-pay

## 2018-04-28 ENCOUNTER — Encounter: Payer: Self-pay | Admitting: Sports Medicine

## 2018-04-28 VITALS — Temp 97.8°F | Resp 16

## 2018-04-28 DIAGNOSIS — S99912D Unspecified injury of left ankle, subsequent encounter: Secondary | ICD-10-CM

## 2018-04-28 DIAGNOSIS — M25572 Pain in left ankle and joints of left foot: Secondary | ICD-10-CM

## 2018-04-28 DIAGNOSIS — M792 Neuralgia and neuritis, unspecified: Secondary | ICD-10-CM

## 2018-04-28 DIAGNOSIS — S93432D Sprain of tibiofibular ligament of left ankle, subsequent encounter: Secondary | ICD-10-CM

## 2018-04-28 MED ORDER — AMITRIPTYLINE HCL 75 MG PO TABS: 75.0000 mg | ORAL_TABLET | Freq: Every day | ORAL | 2 refills | Status: DC

## 2018-04-28 MED ORDER — PREDNISONE 10 MG (21) PO TBPK: ORAL_TABLET | ORAL | 0 refills | Status: DC

## 2018-04-28 NOTE — Progress Notes (Signed)
Subjective:  MIKALIA FESSEL is a 48 y.o. female patient who returns to office for follow-up evaluation of left ankle pain. Patient complains of continued pain in the ankle after a injury that happened while at work on April 16, 2017.  Patient reports that her ankle still hurts states that the more that she is on it and tries to walk it burns and it turns blue and purple states that because she has been out of work and at home with her foot elevated up she has not noticed much swelling or blue to purple discoloration however there is still significant burning and tingling that is constant and sharp in nature 10 out of 10 states that she has tried all of her nerve medicines her gabapentin Cymbalta or Lyrica with no relief however she does report that the gabapentin helps her sleep at night but all of these medicines give her a terrible sciatic of severe headache that is not worth taking the medicine states that she has tried using her Voltaren gel and cam boot which seems to help a little bit however patient is concerned about the extensive amount of pain in her left ankle that is ongoing and still has not gotten approval to get her MRI which has been on hold since COVID-19.  Patient denies any other pedal complaints.    Patient Active Problem List   Diagnosis Date Noted  . Bilateral carotid artery stenosis 03/24/2017  . Left subclavian artery occlusion 03/24/2017  . Occlusion of vertebral artery 03/24/2017  . Chest pain 07/27/2016  . Ventricular premature beats 07/27/2016    Current Outpatient Medications on File Prior to Visit  Medication Sig Dispense Refill  . albuterol (PROVENTIL) (2.5 MG/3ML) 0.083% nebulizer solution Take 3 mLs (2.5 mg total) by nebulization every 4 (four) hours as needed for wheezing or shortness of breath. 75 mL 1  . aspirin (GOODSENSE ASPIRIN) 325 MG tablet Take 325 mg by mouth daily.    . benzonatate (TESSALON) 200 MG capsule     . cetirizine (ZYRTEC) 10 MG tablet TAKE  1 TAB(S) ORALLY ONCE (AT BEDTIME) FOR 30 DAY(S)    . cetirizine-pseudoephedrine (ZYRTEC-D) 5-120 MG tablet Take 1 tablet by mouth 2 (two) times daily.     Marland Kitchen estrogens, conjugated, (PREMARIN) 1.25 MG tablet Take 1.25 mg by mouth daily.    . Fluticasone Furoate (ARNUITY ELLIPTA) 100 MCG/ACT AEPB Inhale 1 Dose into the lungs daily. Rinse, gargle, and spit after use. 1 each 3  . meloxicam (MOBIC) 15 MG tablet Take 15 mg by mouth daily.    Marland Kitchen olopatadine (PATANOL) 0.1 % ophthalmic solution Place 1 drop into both eyes 2 (two) times daily. 5 mL 4  . pantoprazole (PROTONIX) 40 MG tablet     . rosuvastatin (CRESTOR) 10 MG tablet Take 10 mg by mouth daily.    Marland Kitchen triamcinolone cream (KENALOG) 0.1 % TAKE 1 APPLICATION APPLIED TOPICALLY 3 TIMES A DAY FOR 7 DAY(S)    . VENTOLIN HFA 108 (90 Base) MCG/ACT inhaler     . Vitamin D, Ergocalciferol, (DRISDOL) 1.25 MG (50000 UT) CAPS capsule      Current Facility-Administered Medications on File Prior to Visit  Medication Dose Route Frequency Provider Last Rate Last Dose  . ipratropium-albuterol (DUONEB) 0.5-2.5 (3) MG/3ML nebulizer solution 3 mL  3 mL Nebulization Q6H Padgett, Rae Halsted, MD        Allergies  Allergen Reactions  . Ciprofloxacin Hcl Swelling  . Propofol Other (See Comments)  Objective:  General: Alert and oriented x3 in no acute distress  Dermatology: No open lesions bilateral lower extremities, no webspace macerations, no ecchymosis bilateral, all nails x 10 are well manicured.  Vascular: Dorsalis Pedis and Posterior Tibial pedal pulses faintly palpable, Capillary Fill Time 3 seconds,(+) pedal hair growth bilateral, no edema bilateral lower extremities, Temperature gradient within normal limits.  Neurology: Gross sensation intact via light touch bilateral, however there is subjective burning pain sharp shooting positive the low side on left over the dorsal cutaneous nerve distribution like before.  Musculoskeletal: Mild tenderness  with palpation at lateral ankle and tib-fib syndesmosis on left and there is also new pain over the extensor tendons to the left ankle. Negative talar tilt, mild pain with tib-fib stress, mild left ankle instability on left. No pain with calf compression bilateral. Range of motion within normal limits with mild guarding on left ankle. Strength within normal limits in all groups bilateral.   Gait: Antalgic gait Cam boot assisted  Assessment and Plan: Problem List Items Addressed This Visit    None    Visit Diagnoses    Neuritis    -  Primary   Injury of left ankle, subsequent encounter       Sprain of tibiofibular ligament of left ankle, subsequent encounter       Left ankle pain, unspecified chronicity           -Complete examination performed -Discussed treatement options for continued pain after injury at work with suspected high ankle sprain/ligament tear with neuritis -Advised patient to discontinue her gabapentin Lyrica and Cymbalta medications and we will try to see how she responds to amitriptyline patient will start this medication taking 75 mg at bedtime to see if this will give her some additional relief -Prescribed prednisone to see if this will also give her some additional relief with a new finding of tendinitis of her extensor tendons -Recommend to continue with rest elevation topical pain creams and rubs and cam boot; a replacement cam boot was given at this visit -Patient to remain out of work until after her MRIs performed to prevent any further injury; duty paperwork completed on patient's behalf -Patient to return to office in 4 to 6 weeks for medication check or sooner if her MRI is completed.  Landis Martins, DPM

## 2018-06-09 ENCOUNTER — Other Ambulatory Visit: Payer: Self-pay

## 2018-06-09 ENCOUNTER — Ambulatory Visit (INDEPENDENT_AMBULATORY_CARE_PROVIDER_SITE_OTHER): Payer: 59 | Admitting: Sports Medicine

## 2018-06-09 ENCOUNTER — Encounter: Payer: Self-pay | Admitting: Sports Medicine

## 2018-06-09 VITALS — Temp 96.6°F | Resp 16

## 2018-06-09 DIAGNOSIS — S99912D Unspecified injury of left ankle, subsequent encounter: Secondary | ICD-10-CM

## 2018-06-09 DIAGNOSIS — S93432D Sprain of tibiofibular ligament of left ankle, subsequent encounter: Secondary | ICD-10-CM

## 2018-06-09 DIAGNOSIS — M25572 Pain in left ankle and joints of left foot: Secondary | ICD-10-CM | POA: Diagnosis not present

## 2018-06-09 DIAGNOSIS — M792 Neuralgia and neuritis, unspecified: Secondary | ICD-10-CM

## 2018-06-09 MED ORDER — AMITRIPTYLINE HCL 25 MG PO TABS: 25.0000 mg | ORAL_TABLET | Freq: Every day | ORAL | 1 refills | Status: DC

## 2018-06-09 NOTE — Progress Notes (Signed)
Subjective:  Sherri Arnold is a 48 y.o. female patient who returns to office for follow-up evaluation of left ankle pain. Patient complains of continued pain in the ankle after a injury that happened while at work on April 16, 2017.  Patient reports that she still has swelling and pain after being on her foot even though she is wearing the boot states that the change with her nerve medication helps some pain is now 5 out of 10 decrease burning and tingling however there is still significant pain and swelling with long periods of standing and weakness.  Patient reports that she cannot do anything without the cam boot which seems to help and she also does not spend more than 30 minutes on her foot even though she is in the boot due to swelling states that she has to limit her daily activities even when cooking too short intervals and she spends a lot of time elevating and icing on the couch due to her continued pain and swelling.  Patient denies warmth or redness nausea vomiting fever chills or any other constitutional symptoms at this time.    Patient Active Problem List   Diagnosis Date Noted  . Bilateral carotid artery stenosis 03/24/2017  . Left subclavian artery occlusion 03/24/2017  . Occlusion of vertebral artery 03/24/2017  . Chest pain 07/27/2016  . Ventricular premature beats 07/27/2016    Current Outpatient Medications on File Prior to Visit  Medication Sig Dispense Refill  . albuterol (PROVENTIL) (2.5 MG/3ML) 0.083% nebulizer solution Take 3 mLs (2.5 mg total) by nebulization every 4 (four) hours as needed for wheezing or shortness of breath. 75 mL 1  . aspirin (GOODSENSE ASPIRIN) 325 MG tablet Take 325 mg by mouth daily.    . benzonatate (TESSALON) 200 MG capsule     . cetirizine (ZYRTEC) 10 MG tablet TAKE 1 TAB(S) ORALLY ONCE (AT BEDTIME) FOR 30 DAY(S)    . cetirizine-pseudoephedrine (ZYRTEC-D) 5-120 MG tablet Take 1 tablet by mouth 2 (two) times daily.     Marland Kitchen estrogens,  conjugated, (PREMARIN) 1.25 MG tablet Take 1.25 mg by mouth daily.    . Fluticasone Furoate (ARNUITY ELLIPTA) 100 MCG/ACT AEPB Inhale 1 Dose into the lungs daily. Rinse, gargle, and spit after use. 1 each 3  . meloxicam (MOBIC) 15 MG tablet Take 15 mg by mouth daily.    Marland Kitchen olopatadine (PATANOL) 0.1 % ophthalmic solution Place 1 drop into both eyes 2 (two) times daily. 5 mL 4  . pantoprazole (PROTONIX) 40 MG tablet     . predniSONE (STERAPRED UNI-PAK 21 TAB) 10 MG (21) TBPK tablet Take as directed 21 tablet 0  . rosuvastatin (CRESTOR) 10 MG tablet Take 10 mg by mouth daily.    Marland Kitchen triamcinolone cream (KENALOG) 0.1 % TAKE 1 APPLICATION APPLIED TOPICALLY 3 TIMES A DAY FOR 7 DAY(S)    . VENTOLIN HFA 108 (90 Base) MCG/ACT inhaler     . Vitamin D, Ergocalciferol, (DRISDOL) 1.25 MG (50000 UT) CAPS capsule      Current Facility-Administered Medications on File Prior to Visit  Medication Dose Route Frequency Provider Last Rate Last Dose  . ipratropium-albuterol (DUONEB) 0.5-2.5 (3) MG/3ML nebulizer solution 3 mL  3 mL Nebulization Q6H Padgett, Rae Halsted, MD        Allergies  Allergen Reactions  . Ciprofloxacin Hcl Swelling  . Propofol Other (See Comments)    Objective:  General: Alert and oriented x3 in no acute distress  Dermatology: No open lesions bilateral lower  extremities, no webspace macerations, no ecchymosis bilateral, all nails x 10 are well manicured.  Vascular: Dorsalis Pedis and Posterior Tibial pedal pulses faintly palpable, Capillary Fill Time 3 seconds,(+) pedal hair growth bilateral, no edema bilateral lower extremities, Temperature gradient within normal limits.  Neurology: Johney Maine sensation intact via light touch bilateral, however there is subjective burning pain sharp shooting pain that is improving since being on amitriptyline  Musculoskeletal: Mild tenderness with palpation at lateral ankle at the lateral collateral ligaments and tib-fib syndesmosis on left.  Pain is  resolved at the anterior ankle over the extensor tendons.  Negative talar tilt, mild pain with tib-fib stress, mild left ankle instability on left. No pain with calf compression bilateral. Range of motion within normal limits with mild guarding on left ankle. Strength within normal limits in all groups bilateral.   Gait: Antalgic gait Cam boot assisted  Assessment and Plan: Problem List Items Addressed This Visit    None    Visit Diagnoses    Neuritis    -  Primary   Injury of left ankle, subsequent encounter       Sprain of tibiofibular ligament of left ankle, subsequent encounter       Left ankle pain, unspecified chronicity           -Complete examination performed -Re-discussed treatement options for continued pain after injury at work with suspected high ankle sprain/ligament tear with neuritis chronic in nature at this point -Decrease amitriptyline to 25 mg at bedtime patient reported that she feels like the 75 mg is too strong even though it is helpful -Recommend to continue with rest elevation topical pain creams and rubs and cam boot -Dispensed a Tri-Lock ankle brace for patient to use in a tennis shoe if comfortable and may slowly wean from Cam boot on the weekends -Patient to remain out of work until after her MRI is performed to prevent any further injury; duty paperwork completed on patient's behalf again at this visit -Patient to return to office in 4 weeks for medication check or sooner if her MRI is completed.  Landis Martins, DPM

## 2018-06-15 ENCOUNTER — Telehealth: Payer: Self-pay | Admitting: Sports Medicine

## 2018-06-15 NOTE — Telephone Encounter (Signed)
Had questions about MRI. Has it been scheduled and do you need approval?

## 2018-07-10 NOTE — Telephone Encounter (Signed)
Spoke with Edwena Felty ref # [id number] 07/10/2018 after much research it was found the MRI was approved in March but some how lost in their system.  Walked through request CPT and Dx code after checking in 2 systems and with a floor supervisor CPT [phone] MRI of Left ankle w & w/o contrast with Dx code [phone] was a level 1 request and does not require prior authorization

## 2018-07-13 ENCOUNTER — Other Ambulatory Visit: Payer: Self-pay

## 2018-07-13 ENCOUNTER — Ambulatory Visit (INDEPENDENT_AMBULATORY_CARE_PROVIDER_SITE_OTHER): Admitting: Sports Medicine

## 2018-07-13 ENCOUNTER — Encounter: Payer: Self-pay | Admitting: Sports Medicine

## 2018-07-13 VITALS — Temp 97.0°F | Resp 16

## 2018-07-13 DIAGNOSIS — S93432D Sprain of tibiofibular ligament of left ankle, subsequent encounter: Secondary | ICD-10-CM | POA: Diagnosis not present

## 2018-07-13 DIAGNOSIS — M25572 Pain in left ankle and joints of left foot: Secondary | ICD-10-CM

## 2018-07-13 DIAGNOSIS — M792 Neuralgia and neuritis, unspecified: Secondary | ICD-10-CM | POA: Diagnosis not present

## 2018-07-13 DIAGNOSIS — S99912D Unspecified injury of left ankle, subsequent encounter: Secondary | ICD-10-CM | POA: Diagnosis not present

## 2018-07-13 NOTE — Progress Notes (Signed)
Subjective:  Sherri Arnold is a 48 y.o. female patient who returns to office for follow-up evaluation of left ankle pain. Patient complains of continued pain in the ankle after a injury that happened while at work on April 16, 2017.  Patient reports tha the brace made the pain worse and very severe 10 out of 10 that she had to discontinue wearing it states that the pain is in her leg and foot worse with shoe or brace best with cam boot and amitriptyline.  Patient denies any changes with medical history and still is awaiting her MRI.  Patient denies warmth or redness nausea vomiting fever chills or any other constitutional symptoms at this time.  Admits some discoloration when her legs in a dependent position too long and the left greater than the right foot however it is much improved from her initial appearance of her feet from the initial injury.  No other symptoms noted.  Patient Active Problem List   Diagnosis Date Noted  . Bilateral carotid artery stenosis 03/24/2017  . Left subclavian artery occlusion 03/24/2017  . Occlusion of vertebral artery 03/24/2017  . Chest pain 07/27/2016  . Ventricular premature beats 07/27/2016    Current Outpatient Medications on File Prior to Visit  Medication Sig Dispense Refill  . albuterol (PROVENTIL) (2.5 MG/3ML) 0.083% nebulizer solution Take 3 mLs (2.5 mg total) by nebulization every 4 (four) hours as needed for wheezing or shortness of breath. 75 mL 1  . amitriptyline (ELAVIL) 25 MG tablet Take 1 tablet (25 mg total) by mouth at bedtime. 30 tablet 1  . amitriptyline (ELAVIL) 25 MG tablet     . aspirin (GOODSENSE ASPIRIN) 325 MG tablet Take 325 mg by mouth daily.    . benzonatate (TESSALON) 200 MG capsule     . cetirizine (ZYRTEC) 10 MG tablet TAKE 1 TAB(S) ORALLY ONCE (AT BEDTIME) FOR 30 DAY(S)    . cetirizine-pseudoephedrine (ZYRTEC-D) 5-120 MG tablet Take 1 tablet by mouth 2 (two) times daily.     Marland Kitchen estrogens, conjugated, (PREMARIN) 1.25 MG tablet  Take 1.25 mg by mouth daily.    . Fluticasone Furoate (ARNUITY ELLIPTA) 100 MCG/ACT AEPB Inhale 1 Dose into the lungs daily. Rinse, gargle, and spit after use. 1 each 3  . meloxicam (MOBIC) 15 MG tablet Take 15 mg by mouth daily.    Marland Kitchen olopatadine (PATANOL) 0.1 % ophthalmic solution Place 1 drop into both eyes 2 (two) times daily. 5 mL 4  . pantoprazole (PROTONIX) 40 MG tablet     . predniSONE (STERAPRED UNI-PAK 21 TAB) 10 MG (21) TBPK tablet Take as directed 21 tablet 0  . rosuvastatin (CRESTOR) 10 MG tablet Take 10 mg by mouth daily.    Marland Kitchen triamcinolone cream (KENALOG) 0.1 % TAKE 1 APPLICATION APPLIED TOPICALLY 3 TIMES A DAY FOR 7 DAY(S)    . VENTOLIN HFA 108 (90 Base) MCG/ACT inhaler     . Vitamin D, Ergocalciferol, (DRISDOL) 1.25 MG (50000 UT) CAPS capsule      Current Facility-Administered Medications on File Prior to Visit  Medication Dose Route Frequency Provider Last Rate Last Dose  . ipratropium-albuterol (DUONEB) 0.5-2.5 (3) MG/3ML nebulizer solution 3 mL  3 mL Nebulization Q6H Padgett, Rae Halsted, MD        Allergies  Allergen Reactions  . Ciprofloxacin Hcl Swelling  . Propofol Other (See Comments)    Objective:  General: Alert and oriented x3 in no acute distress  Dermatology: No open lesions bilateral lower extremities, no webspace  macerations, no ecchymosis bilateral, all nails x 10 are well manicured.  Vascular: Dorsalis Pedis and Posterior Tibial pedal pulses faintly palpable, Capillary Fill Time 3 seconds,(+) pedal hair growth bilateral, no edema bilateral lower extremities, Temperature gradient within normal limits.  Neurology: Johney Maine sensation intact via light touch bilateral, however there is subjective burning pain sharp shooting pain that is improving since being on amitriptyline like before  Musculoskeletal: Mild tenderness with palpation at lateral ankle at the lateral collateral ligaments and tib-fib syndesmosis on left.  Pain is resolved at the anterior  ankle over the extensor tendons but new pain along the calf and lateral leg and anterior shin occasionally.  Negative talar tilt, mild pain with tib-fib stress, mild left ankle instability on left. No pain with calf compression bilateral. Range of motion within normal limits with mild guarding on left ankle.  Reports more mobility with range of motion to toes since being on amitriptyline.  Strength within normal limits in all groups bilateral.   Gait: Antalgic gait Cam boot assisted  Assessment and Plan: Problem List Items Addressed This Visit    None    Visit Diagnoses    Neuritis    -  Primary   Injury of left ankle, subsequent encounter       Sprain of tibiofibular ligament of left ankle, subsequent encounter       Left ankle pain, unspecified chronicity           -Complete examination performed -Re-discussed treatement options for continued pain after injury at work with suspected high ankle sprain/ligament tear with neuritis chronic in nature at this point with discoloration likely from vascular trauma and old injury -Continue with amitriptyline 25 mg at bedtime and advised patient if she is having muscle pain may use Flexeril during the day but do not mix the medications -Recommend to continue with rest elevation topical pain creams and rubs and cam boot -Continue with cam boot since she cannot wear a Tri-Lock brace comfortably with her tennis shoe -Patient to remain out of work until after her MRI is performed to prevent any further injury; duty paperwork completed on patient's behalf again at this visit -Patient to return to office after MRI is completed.  Advised patient depending on the results of the MRI may even consider vascular studies if she is still complaining of discoloration to feet and legs especially on the left.  Landis Martins, DPM

## 2018-07-13 NOTE — Telephone Encounter (Signed)
Patient is scheduled for MRI at 10am on 07/18/2018 at [facility] patient has copy of all paper work in case of any issues

## 2018-07-19 ENCOUNTER — Encounter: Payer: Self-pay | Admitting: *Deleted

## 2018-07-19 ENCOUNTER — Telehealth: Payer: Self-pay | Admitting: *Deleted

## 2018-07-19 NOTE — Telephone Encounter (Signed)
Dr. Cannon Kettle requested MRI disc copy to be sent for over read.

## 2018-07-19 NOTE — Telephone Encounter (Signed)
Faxed request for copy of MRI disc for 07/18/2018 and 07/04/2017 from [place].

## 2018-07-19 NOTE — Telephone Encounter (Signed)
Unable to leave a message on home phone voice mailbox was full, to inform pt Dr. Cannon Kettle had reviewed the MRI results and requested a copy of the MRI disc to be sent to a radiology specialist for more details to plan treatment. Mailed letter with information concerning the delay.

## 2018-07-21 ENCOUNTER — Ambulatory Visit (INDEPENDENT_AMBULATORY_CARE_PROVIDER_SITE_OTHER): Admitting: Sports Medicine

## 2018-07-21 ENCOUNTER — Encounter: Payer: Self-pay | Admitting: Sports Medicine

## 2018-07-21 ENCOUNTER — Other Ambulatory Visit: Payer: Self-pay

## 2018-07-21 VITALS — Temp 99.5°F | Resp 16

## 2018-07-21 DIAGNOSIS — M25572 Pain in left ankle and joints of left foot: Secondary | ICD-10-CM | POA: Diagnosis not present

## 2018-07-21 DIAGNOSIS — S93432D Sprain of tibiofibular ligament of left ankle, subsequent encounter: Secondary | ICD-10-CM | POA: Diagnosis not present

## 2018-07-21 DIAGNOSIS — S99912D Unspecified injury of left ankle, subsequent encounter: Secondary | ICD-10-CM

## 2018-07-21 DIAGNOSIS — M792 Neuralgia and neuritis, unspecified: Secondary | ICD-10-CM | POA: Diagnosis not present

## 2018-07-21 MED ORDER — PREDNISONE 10 MG (21) PO TBPK: ORAL_TABLET | ORAL | 0 refills | Status: DC

## 2018-07-21 NOTE — Progress Notes (Signed)
Subjective: Sherri Arnold is a 48 y.o. female patient who returns to office for follow-up evaluation of left ankle pain. Patient complains of continued pain in the ankle after a injury that happened while at work on April 16, 2017.  Patient reports that she had an episode of swelling after her MRI however overall the pain is the same 8-9 out of 10.  Patient denies any changes with medical history.  No other symptoms noted.   Patient Active Problem List   Diagnosis Date Noted  . Bilateral carotid artery stenosis 03/24/2017  . Left subclavian artery occlusion 03/24/2017  . Occlusion of vertebral artery 03/24/2017  . Chest pain 07/27/2016  . Ventricular premature beats 07/27/2016    Current Outpatient Medications on File Prior to Visit  Medication Sig Dispense Refill  . albuterol (PROVENTIL) (2.5 MG/3ML) 0.083% nebulizer solution Take 3 mLs (2.5 mg total) by nebulization every 4 (four) hours as needed for wheezing or shortness of breath. 75 mL 1  . amitriptyline (ELAVIL) 25 MG tablet Take 1 tablet (25 mg total) by mouth at bedtime. 30 tablet 1  . amitriptyline (ELAVIL) 25 MG tablet     . aspirin (GOODSENSE ASPIRIN) 325 MG tablet Take 325 mg by mouth daily.    . benzonatate (TESSALON) 200 MG capsule     . cetirizine (ZYRTEC) 10 MG tablet TAKE 1 TAB(S) ORALLY ONCE (AT BEDTIME) FOR 30 DAY(S)    . cetirizine-pseudoephedrine (ZYRTEC-D) 5-120 MG tablet Take 1 tablet by mouth 2 (two) times daily.     Marland Kitchen estrogens, conjugated, (PREMARIN) 1.25 MG tablet Take 1.25 mg by mouth daily.    . Fluticasone Furoate (ARNUITY ELLIPTA) 100 MCG/ACT AEPB Inhale 1 Dose into the lungs daily. Rinse, gargle, and spit after use. 1 each 3  . meloxicam (MOBIC) 15 MG tablet Take 15 mg by mouth daily.    Marland Kitchen olopatadine (PATANOL) 0.1 % ophthalmic solution Place 1 drop into both eyes 2 (two) times daily. 5 mL 4  . pantoprazole (PROTONIX) 40 MG tablet     . rosuvastatin (CRESTOR) 10 MG tablet Take 10 mg by mouth daily.    Marland Kitchen  triamcinolone cream (KENALOG) 0.1 % TAKE 1 APPLICATION APPLIED TOPICALLY 3 TIMES A DAY FOR 7 DAY(S)    . VENTOLIN HFA 108 (90 Base) MCG/ACT inhaler     . Vitamin D, Ergocalciferol, (DRISDOL) 1.25 MG (50000 UT) CAPS capsule      Current Facility-Administered Medications on File Prior to Visit  Medication Dose Route Frequency Provider Last Rate Last Dose  . ipratropium-albuterol (DUONEB) 0.5-2.5 (3) MG/3ML nebulizer solution 3 mL  3 mL Nebulization Q6H Padgett, Rae Halsted, MD        Allergies  Allergen Reactions  . Ciprofloxacin Hcl Swelling  . Propofol Other (See Comments)    Objective:  General: Alert and oriented x3 in no acute distress  Dermatology: No open lesions bilateral lower extremities, no webspace macerations, no ecchymosis bilateral, all nails x 10 are well manicured.  Vascular: Dorsalis Pedis and Posterior Tibial pedal pulses faintly palpable, Capillary Fill Time 3 seconds,(+) pedal hair growth bilateral, no edema bilateral lower extremities, Temperature gradient within normal limits.  Neurology: Johney Maine sensation intact via light touch bilateral, however there is subjective burning pain sharp shooting pain that is improving since being on amitriptyline like before  Musculoskeletal: Mild tenderness with palpation at lateral ankle at the lateral collateral ligaments and tib-fib syndesmosis on left.  Mild pain today at the anterior ankle over the extensor tendons but  new pain along the calf and lateral leg and anterior shin occasionally.  Subjective swelling at the Achilles posterior heel on the left.  Negative talar tilt, mild pain with tib-fib stress, mild left ankle instability on left. No pain with calf compression bilateral. Range of motion within normal limits with mild guarding on left ankle.  Reports more mobility with range of motion to toes since being on amitriptyline like before.  Strength within normal limits in all groups bilateral.   Gait: Antalgic gait Cam boot  assisted  Assessment and Plan: Problem List Items Addressed This Visit    None    Visit Diagnoses    Sprain of tibiofibular ligament of left ankle, subsequent encounter    -  Primary   Injury of left ankle, subsequent encounter       Neuritis       Left ankle pain, unspecified chronicity           -Complete examination performed -Re-discussed treatement options for continued pain after injury at work with suspected high ankle sprain/ligament tear with neuritis chronic in nature at this point with discoloration likely from vascular trauma and old injury -MRI results reviewed none impressive based on patient's symptoms suggestive of a little bit of tendnosis and swelling at the Achilles -Continue with amitriptyline 25 mg at bedtime and advised patient if she is having muscle pain may use Flexeril during the day but do not mix the medications -Recommend to continue with rest elevation topical pain creams and rubs and cam boot -Prescribed prednisone to see if this will help with pain swelling and inflammation -Patient to remain out of work until MRI overread is completed -Patient to return to office 2 weeks and may consider vascular studies in the future since patient is a smoker and is complaining of continued pain and discoloration to feet and legs with episodic swelling.  Landis Martins, DPM

## 2018-08-04 ENCOUNTER — Ambulatory Visit: Payer: 59 | Admitting: Sports Medicine

## 2018-08-08 NOTE — Telephone Encounter (Signed)
Mailed copy of MRI disc to [facility]. 

## 2018-08-09 ENCOUNTER — Encounter: Payer: Self-pay | Admitting: Sports Medicine

## 2018-08-11 ENCOUNTER — Ambulatory Visit (INDEPENDENT_AMBULATORY_CARE_PROVIDER_SITE_OTHER): Payer: Worker's Compensation | Admitting: Sports Medicine

## 2018-08-11 ENCOUNTER — Telehealth: Payer: Self-pay | Admitting: *Deleted

## 2018-08-11 ENCOUNTER — Encounter: Payer: Self-pay | Admitting: Sports Medicine

## 2018-08-11 ENCOUNTER — Other Ambulatory Visit: Payer: Self-pay

## 2018-08-11 VITALS — Temp 98.8°F | Resp 16

## 2018-08-11 DIAGNOSIS — M79672 Pain in left foot: Secondary | ICD-10-CM

## 2018-08-11 DIAGNOSIS — M25572 Pain in left ankle and joints of left foot: Secondary | ICD-10-CM

## 2018-08-11 DIAGNOSIS — S99912D Unspecified injury of left ankle, subsequent encounter: Secondary | ICD-10-CM

## 2018-08-11 DIAGNOSIS — M792 Neuralgia and neuritis, unspecified: Secondary | ICD-10-CM

## 2018-08-11 DIAGNOSIS — I739 Peripheral vascular disease, unspecified: Secondary | ICD-10-CM

## 2018-08-11 DIAGNOSIS — S93432D Sprain of tibiofibular ligament of left ankle, subsequent encounter: Secondary | ICD-10-CM

## 2018-08-11 DIAGNOSIS — G90522 Complex regional pain syndrome I of left lower limb: Secondary | ICD-10-CM

## 2018-08-11 NOTE — Telephone Encounter (Signed)
-----   Message from Landis Martins, [place] sent at 08/09/2018 11:59 AM EDT ----- I have put in the request to have her MRI over-read. Do you know that status of her over-read results? She will see me again in office on Friday and I want to have it to discuss with her by then Thanks Dr. Chauncey Cruel

## 2018-08-11 NOTE — Telephone Encounter (Signed)
Left message informing pt I would call for her overread report and to keep her appt today with Dr. Cannon Kettle.

## 2018-08-11 NOTE — Telephone Encounter (Signed)
That was her original MRI on 7-7. I need over read results to evaluate the tib fib ligament at area of high sprain Thanks Dr Chauncey Cruel

## 2018-08-11 NOTE — Telephone Encounter (Signed)
You're welcome! Thanks

## 2018-08-11 NOTE — Telephone Encounter (Signed)
I called [facility] - Gabriel Cirri states it is not in yet but will put a rush on it.

## 2018-08-11 NOTE — Progress Notes (Signed)
Subjective: Sherri Arnold is a 48 y.o. female patient who returns to office for follow-up evaluation of left ankle pain. Patient complains of continued pain in the ankle after a injury that happened while at work on April 16, 2017.  Patient reports tha the pain is still the same lots of nerve pain sharp shooting burning tingling numbness especially at the first second and third toes states that they are really painful difficult to move pain is 9 out of 10 with some swelling and limb discoloration patient reports that she tried to go a day without her boot and wear her shoe but she had increased pain and could not go without her boot patient reports that the amitriptyline and Flexeril helps a little bit but by being on her foot more than 10 minutes the pain is very severe and continues to have to limit her activities rest at home and elevate reports that because of her pain she cannot do anything more and feels unfit to be able to work because she cannot sit walk or stand no more than 10 to 15 minutes before there is significant pain. Patient denies any changes with medical history.  No other symptoms noted.   Patient Active Problem List   Diagnosis Date Noted  . Bilateral carotid artery stenosis 03/24/2017  . Left subclavian artery occlusion 03/24/2017  . Occlusion of vertebral artery 03/24/2017  . Chest pain 07/27/2016  . Ventricular premature beats 07/27/2016    Current Outpatient Medications on File Prior to Visit  Medication Sig Dispense Refill  . albuterol (PROVENTIL) (2.5 MG/3ML) 0.083% nebulizer solution Take 3 mLs (2.5 mg total) by nebulization every 4 (four) hours as needed for wheezing or shortness of breath. 75 mL 1  . amitriptyline (ELAVIL) 25 MG tablet Take 1 tablet (25 mg total) by mouth at bedtime. 30 tablet 1  . amitriptyline (ELAVIL) 25 MG tablet     . aspirin (GOODSENSE ASPIRIN) 325 MG tablet Take 325 mg by mouth daily.    . benzonatate (TESSALON) 200 MG capsule     .  cetirizine (ZYRTEC) 10 MG tablet TAKE 1 TAB(S) ORALLY ONCE (AT BEDTIME) FOR 30 DAY(S)    . cetirizine-pseudoephedrine (ZYRTEC-D) 5-120 MG tablet Take 1 tablet by mouth 2 (two) times daily.     Marland Kitchen estrogens, conjugated, (PREMARIN) 1.25 MG tablet Take 1.25 mg by mouth daily.    . Fluticasone Furoate (ARNUITY ELLIPTA) 100 MCG/ACT AEPB Inhale 1 Dose into the lungs daily. Rinse, gargle, and spit after use. 1 each 3  . meloxicam (MOBIC) 15 MG tablet Take 15 mg by mouth daily.    Marland Kitchen olopatadine (PATANOL) 0.1 % ophthalmic solution Place 1 drop into both eyes 2 (two) times daily. 5 mL 4  . pantoprazole (PROTONIX) 40 MG tablet     . predniSONE (STERAPRED UNI-PAK 21 TAB) 10 MG (21) TBPK tablet Take as directed 21 tablet 0  . rosuvastatin (CRESTOR) 10 MG tablet Take 10 mg by mouth daily.    Marland Kitchen triamcinolone cream (KENALOG) 0.1 % TAKE 1 APPLICATION APPLIED TOPICALLY 3 TIMES A DAY FOR 7 DAY(S)    . VENTOLIN HFA 108 (90 Base) MCG/ACT inhaler     . Vitamin D, Ergocalciferol, (DRISDOL) 1.25 MG (50000 UT) CAPS capsule      Current Facility-Administered Medications on File Prior to Visit  Medication Dose Route Frequency Provider Last Rate Last Dose  . ipratropium-albuterol (DUONEB) 0.5-2.5 (3) MG/3ML nebulizer solution 3 mL  3 mL Nebulization Q6H Padgett, Rae Halsted, MD  Allergies  Allergen Reactions  . Ciprofloxacin Hcl Swelling  . Propofol Other (See Comments)    Objective:  General: Alert and oriented x3 in no acute distress  Dermatology: No open lesions bilateral lower extremities, no webspace macerations, no ecchymosis bilateral, all nails x 10 are well manicured.  Vascular: Dorsalis Pedis and Posterior Tibial pedal pulses faintly palpable, Capillary Fill Time 3 seconds,(+) pedal hair growth bilateral, no edema bilateral lower extremities, Temperature gradient within normal limits with a mild blue hue to the lower extremities.  Neurology: Johney Maine sensation intact via light touch bilateral,  however there is subjective burning pain sharp shooting pain worse at toes 1 2 and 3 on left  Musculoskeletal: Mild moderate tenderness with palpation at lateral ankle at the lateral collateral ligaments and tib-fib syndesmosis on left.  Mild pain to use at the anterior ankle over the extensor tendons with pain along the calf and lateral leg and anterior shin occasionally.  Subjective swelling at the Achilles posterior heel on the left.  Negative talar tilt, mild pain with tib-fib stress, mild left ankle instability on left. No pain with calf compression bilateral. Range of motion within normal limits with mild guarding on left ankle.  Reports more mobility with range of motion to toes since being on amitriptyline like before however with the same amount of nerve pain.  Strength within normal limits in all groups bilateral.   Gait: Antalgic gait Cam boot assisted  Assessment and Plan: Problem List Items Addressed This Visit    None    Visit Diagnoses    Sprain of tibiofibular ligament of left ankle, subsequent encounter    -  Primary   Injury of left ankle, subsequent encounter       Neuritis       Complex regional pain syndrome type 1 of left lower extremity       Left ankle pain, unspecified chronicity       Left foot pain       PVD (peripheral vascular disease) (HCC)           -Complete examination performed -Re-discussed treatement options for continued pain after injury at work with suspected high ankle sprain/ligament tear with neuritis chronic in nature at this point with discoloration likely from vascular trauma and old injury likely exhibiting now symptoms of chronic regional pain syndrome type I -Ordered nerve conduction test to compare to a previous test that she had done through her private insurance to evaluate the extent of nerve damage and to help with making the diagnosis of chronic regional pain syndrome -Ordered ABIs for further evaluation to make sure there is no  superimposed vascular component due to the nature of her injury and for a further evaluation for likely chronic regional pain syndrome -Awaiting MRI overread results -Continue with amitriptyline 25 mg at bedtime and advised patient if she is having muscle pain may use Flexeril during the day but do not mix the medications like before -Recommend to continue with rest elevation topical pain creams and rubs like before -Patient to remain out of work until MRI overread and secondary testing is completed; patient is exhibiting signs of regression prognosis at this point seems poor since her patient have been going on for this extent of time with no clinical improvement likely exhibiting symptoms of chronic regional pain syndrome  -Patient to return to office after nerve conduction and ABI testing to evaluate further for chronic regional pain syndrome and MRI over read results are available.  Landis Martins,  DPM

## 2018-08-14 ENCOUNTER — Telehealth: Payer: Self-pay | Admitting: *Deleted

## 2018-08-14 ENCOUNTER — Other Ambulatory Visit: Payer: Self-pay

## 2018-08-14 DIAGNOSIS — G90522 Complex regional pain syndrome I of left lower limb: Secondary | ICD-10-CM

## 2018-08-14 DIAGNOSIS — I739 Peripheral vascular disease, unspecified: Secondary | ICD-10-CM

## 2018-08-14 DIAGNOSIS — M79672 Pain in left foot: Secondary | ICD-10-CM

## 2018-08-14 DIAGNOSIS — M79605 Pain in left leg: Secondary | ICD-10-CM

## 2018-08-14 DIAGNOSIS — S93432D Sprain of tibiofibular ligament of left ankle, subsequent encounter: Secondary | ICD-10-CM

## 2018-08-14 DIAGNOSIS — M792 Neuralgia and neuritis, unspecified: Secondary | ICD-10-CM

## 2018-08-14 DIAGNOSIS — M25572 Pain in left ankle and joints of left foot: Secondary | ICD-10-CM

## 2018-08-14 DIAGNOSIS — S99912D Unspecified injury of left ankle, subsequent encounter: Secondary | ICD-10-CM

## 2018-08-14 NOTE — Telephone Encounter (Signed)
-----   Message from Landis Martins, [place] sent at 08/11/2018  9:56 PM EDT ----- Regarding: ABIs and NCVs ABIs rule out PVD in the setting of chronic pain syndrome  Nerve conduction test to evaluate extent of nerve damage history of work-related injury from April 2019 with still significant pain out of proportion evaluate for possible chronic pain syndrome.

## 2018-08-14 NOTE — Telephone Encounter (Signed)
[facility] scheduled [phone] for ABI B/L for 08/30/2018 arrive 2:30pm for 3:00pm testing . Faxed to [place].

## 2018-08-14 NOTE — Telephone Encounter (Signed)
Faxed orders for NCV with EMG to [facility].

## 2018-08-14 NOTE — Telephone Encounter (Signed)
Left message informing pt of 08/30/2018 [facility] AB with TBI.

## 2018-08-15 DIAGNOSIS — K1120 Sialoadenitis, unspecified: Secondary | ICD-10-CM

## 2018-08-15 DIAGNOSIS — K111 Hypertrophy of salivary gland: Secondary | ICD-10-CM

## 2018-08-15 DIAGNOSIS — F17210 Nicotine dependence, cigarettes, uncomplicated: Secondary | ICD-10-CM | POA: Insufficient documentation

## 2018-08-15 DIAGNOSIS — J381 Polyp of vocal cord and larynx: Secondary | ICD-10-CM

## 2018-08-15 DIAGNOSIS — R490 Dysphonia: Secondary | ICD-10-CM | POA: Insufficient documentation

## 2018-08-15 HISTORY — DX: Polyp of vocal cord and larynx: J38.1

## 2018-08-15 HISTORY — DX: Sialoadenitis, unspecified: K11.20

## 2018-08-15 HISTORY — DX: Hypertrophy of salivary gland: K11.1

## 2018-08-15 HISTORY — DX: Nicotine dependence, cigarettes, uncomplicated: F17.210

## 2018-08-17 ENCOUNTER — Other Ambulatory Visit: Payer: Self-pay

## 2018-08-17 ENCOUNTER — Ambulatory Visit: Payer: Self-pay | Admitting: Neurology

## 2018-08-17 ENCOUNTER — Encounter: Payer: Self-pay | Admitting: Sports Medicine

## 2018-08-21 NOTE — Telephone Encounter (Signed)
Sherri Arnold states Sherri Arnold stated they were having trouble with a disc. I spoke with Modena Nunnery and she states she received a from [place], it runs fast like there is not data on it.

## 2018-08-31 NOTE — Telephone Encounter (Signed)
I called and discussed nerve study results from January that revealed that she had nerve damage.    Patient is still awaiting a updated nerve study through Workmen's Comp.  Patient reports that she went for her nerve studies for workman comp at [facility] but they did not see her because they do not take Workmen's Comp.  I advised patient that I will have my office staff follow-up on this request to get her nerve studies done.   Patient also is still awaiting the over read results from her MRI from 07/18/2018 please assist by following up on this.    Also patient reports that she went on August 6 for ABIs I do not see those results in her chart please follow-up on getting these results for me.  Thanks Dr. Cannon Kettle

## 2018-08-31 NOTE — Telephone Encounter (Addendum)
Left message for Worker's [employer] to call with information on scheduling Claim# [id number] for NCV with EMG.

## 2018-09-05 NOTE — Telephone Encounter (Signed)
Unable to get an answer on the phone number for Worker's Comp. I asked A. Smith - scheduler to call the same number,because I had previously been able to leave a message. Charmayne Sheer stated the phone number was invalid.

## 2018-09-06 NOTE — Telephone Encounter (Signed)
Let message requesting pt contact the Worker's Comp representative to call me [phone], or fax [phone] with information concerning their preferred Neurology group for the nerve conduction studies.

## 2018-09-06 NOTE — Telephone Encounter (Signed)
Faxed request to [facility] for MRI of 07/18/2018 disc to be printed from the machine in [employer] format and mailed to our office, and for the results of 08/30/2018 ABI to be faxed to [facility].

## 2018-09-06 NOTE — Telephone Encounter (Signed)
It does not connect to [facility] they have a different system that does not show up in care everywhere

## 2018-09-08 ENCOUNTER — Other Ambulatory Visit: Payer: Self-pay

## 2018-09-08 ENCOUNTER — Encounter: Payer: Self-pay | Admitting: Sports Medicine

## 2018-09-08 ENCOUNTER — Ambulatory Visit (INDEPENDENT_AMBULATORY_CARE_PROVIDER_SITE_OTHER): Admitting: Sports Medicine

## 2018-09-08 VITALS — Temp 99.1°F | Resp 16

## 2018-09-08 DIAGNOSIS — S93432D Sprain of tibiofibular ligament of left ankle, subsequent encounter: Secondary | ICD-10-CM | POA: Diagnosis not present

## 2018-09-08 DIAGNOSIS — M25572 Pain in left ankle and joints of left foot: Secondary | ICD-10-CM

## 2018-09-08 DIAGNOSIS — M79672 Pain in left foot: Secondary | ICD-10-CM

## 2018-09-08 DIAGNOSIS — S99912D Unspecified injury of left ankle, subsequent encounter: Secondary | ICD-10-CM | POA: Diagnosis not present

## 2018-09-08 DIAGNOSIS — G90522 Complex regional pain syndrome I of left lower limb: Secondary | ICD-10-CM | POA: Diagnosis not present

## 2018-09-08 DIAGNOSIS — M792 Neuralgia and neuritis, unspecified: Secondary | ICD-10-CM

## 2018-09-08 NOTE — Progress Notes (Signed)
Subjective: Sherri Arnold is a 48 y.o. female patient who returns to office for follow-up evaluation of left ankle pain. Patient complains of continued pain in the ankle after a injury that happened while at work on April 16, 2017.  Patient reports tha the pain is still the same with pain worsening now even at the level of the calf.  Patient reports that pain is 9 out of 10 today with some swelling reports that she has been using her cam boot and icing reports that she discontinue her amitriptyline and Flexeril because of buzzing in her ear but the buzzing still continue so now she knows that it is not these medications that are causing the buzzing noise in her ear.  Patient denies other symptoms noted.   Patient Active Problem List   Diagnosis Date Noted  . Bilateral carotid artery stenosis 03/24/2017  . Left subclavian artery occlusion 03/24/2017  . Occlusion of vertebral artery 03/24/2017  . Chest pain 07/27/2016  . Ventricular premature beats 07/27/2016    Current Outpatient Medications on File Prior to Visit  Medication Sig Dispense Refill  . albuterol (PROVENTIL) (2.5 MG/3ML) 0.083% nebulizer solution Take 3 mLs (2.5 mg total) by nebulization every 4 (four) hours as needed for wheezing or shortness of breath. 75 mL 1  . amitriptyline (ELAVIL) 25 MG tablet Take 1 tablet (25 mg total) by mouth at bedtime. 30 tablet 1  . amitriptyline (ELAVIL) 25 MG tablet     . aspirin (GOODSENSE ASPIRIN) 325 MG tablet Take 325 mg by mouth daily.    . benzonatate (TESSALON) 200 MG capsule     . cetirizine (ZYRTEC) 10 MG tablet TAKE 1 TAB(S) ORALLY ONCE (AT BEDTIME) FOR 30 DAY(S)    . cetirizine-pseudoephedrine (ZYRTEC-D) 5-120 MG tablet Take 1 tablet by mouth 2 (two) times daily.     Marland Kitchen estrogens, conjugated, (PREMARIN) 1.25 MG tablet Take 1.25 mg by mouth daily.    . Fluticasone Furoate (ARNUITY ELLIPTA) 100 MCG/ACT AEPB Inhale 1 Dose into the lungs daily. Rinse, gargle, and spit after use. 1 each 3  .  meloxicam (MOBIC) 15 MG tablet Take 15 mg by mouth daily.    Marland Kitchen olopatadine (PATANOL) 0.1 % ophthalmic solution Place 1 drop into both eyes 2 (two) times daily. 5 mL 4  . pantoprazole (PROTONIX) 40 MG tablet     . predniSONE (STERAPRED UNI-PAK 21 TAB) 10 MG (21) TBPK tablet Take as directed 21 tablet 0  . rosuvastatin (CRESTOR) 10 MG tablet Take 10 mg by mouth daily.    Marland Kitchen triamcinolone cream (KENALOG) 0.1 % TAKE 1 APPLICATION APPLIED TOPICALLY 3 TIMES A DAY FOR 7 DAY(S)    . VENTOLIN HFA 108 (90 Base) MCG/ACT inhaler     . Vitamin D, Ergocalciferol, (DRISDOL) 1.25 MG (50000 UT) CAPS capsule      Current Facility-Administered Medications on File Prior to Visit  Medication Dose Route Frequency Provider Last Rate Last Dose  . ipratropium-albuterol (DUONEB) 0.5-2.5 (3) MG/3ML nebulizer solution 3 mL  3 mL Nebulization Q6H Padgett, Rae Halsted, MD        Allergies  Allergen Reactions  . Ciprofloxacin Hcl Swelling  . Propofol Other (See Comments)    Objective:  General: Alert and oriented x3 in no acute distress  Dermatology: No open lesions bilateral lower extremities, no webspace macerations, no ecchymosis bilateral, all nails x 10 are well manicured.  Vascular: Dorsalis Pedis and Posterior Tibial pedal pulses faintly palpable, Capillary Fill Time 3 seconds,(+) pedal hair  growth bilateral, no edema bilateral lower extremities, Temperature gradient within normal limits with a mild blue hue to the lower extremities like before but feet are warm.  Neurology: Johney Maine sensation intact via light touch bilateral, however there is subjective burning pain sharp shooting pain worse at toes 1 2 and 3 on left like before  Musculoskeletal: Mild to moderate tenderness with palpation at lateral ankle at the lateral collateral ligaments and tib-fib syndesmosis on left.  Mild pain to use at the anterior ankle over the extensor tendons with pain along the calf and lateral leg and anterior shin occasionally  and now pain up calf.  Subjective swelling at the Achilles posterior heel on the left.  Negative talar tilt, mild pain with tib-fib stress, mild left ankle instability on left. No pain with calf compression bilateral. Range of motion within normal limits with mild guarding on left ankle.  Reports more mobility with range of motion to toes since being on amitriptyline like before however with the same amount of nerve pain.  Strength within normal limits in all groups bilateral.   Gait: Antalgic gait Cam boot assisted  Assessment and Plan: Problem List Items Addressed This Visit    None    Visit Diagnoses    Injury of left ankle, subsequent encounter    -  Primary   Neuritis       Sprain of tibiofibular ligament of left ankle, subsequent encounter       Complex regional pain syndrome type 1 of left lower extremity       Left ankle pain, unspecified chronicity       Left foot pain          -Complete examination performed -Re-discussed treatement options for continued pain after injury at work with suspected high ankle sprain/ligament tear with neuritis chronic in nature at this point with discoloration likely from vascular trauma and old injury with symptoms of chronic regional pain syndrome type I -Vascular studies negative -Awaiting NCV tests -Awaiting MRI over-read -Advised patient to resume flexeril  -Recommend to continue with rest elevation topical pain creams and rubs like before -Continue with CAM boot -Patient to remain out of work since she is unable to work due to chronic limb pain; duty paperwork and disability paperwork completed   Landis Martins, DPM

## 2018-09-12 NOTE — Telephone Encounter (Signed)
Received 2nd disc and mailed to [facility].

## 2018-09-21 NOTE — Telephone Encounter (Signed)
[facility] - Records-Betsy states can burn from scanner in [facility], and will get sent out today.

## 2018-09-21 NOTE — Telephone Encounter (Signed)
I spoke with Modena Nunnery she states the 2nd MRI will open and they can review the images, but paperwork states compare to the initial MRI, they don't have the initial MRI.

## 2018-10-04 NOTE — Telephone Encounter (Signed)
Sherri Arnold -  Imaging requesting the 07/04/2017 left ankle MRI printed from the MRI machine mailed to our office.

## 2018-10-13 ENCOUNTER — Encounter: Payer: Self-pay | Admitting: Sports Medicine

## 2018-10-13 ENCOUNTER — Other Ambulatory Visit: Payer: Self-pay

## 2018-10-13 ENCOUNTER — Ambulatory Visit (INDEPENDENT_AMBULATORY_CARE_PROVIDER_SITE_OTHER): Admitting: Sports Medicine

## 2018-10-13 DIAGNOSIS — M25572 Pain in left ankle and joints of left foot: Secondary | ICD-10-CM

## 2018-10-13 DIAGNOSIS — S99912D Unspecified injury of left ankle, subsequent encounter: Secondary | ICD-10-CM | POA: Diagnosis not present

## 2018-10-13 DIAGNOSIS — S93432D Sprain of tibiofibular ligament of left ankle, subsequent encounter: Secondary | ICD-10-CM | POA: Diagnosis not present

## 2018-10-13 DIAGNOSIS — G90522 Complex regional pain syndrome I of left lower limb: Secondary | ICD-10-CM

## 2018-10-13 DIAGNOSIS — M79672 Pain in left foot: Secondary | ICD-10-CM

## 2018-10-13 NOTE — Progress Notes (Signed)
Subjective: Sherri Arnold is a 48 y.o. female patient who returns to office for follow-up evaluation of left ankle pain. Patient complains of continued pain in the ankle after a injury that happened while at work on April 16, 2017.  Patient reports tha the pain is still the same 8 out of 10 with swelling coldness to feet.  Currently taking Flexeril using Biofreeze icing and elevation admits to a purple coloration depending on the positions of her leg.  No other pedal complaints noted at this time.  Patient is assisted by case manager this visit.  Patient Active Problem List   Diagnosis Date Noted  . Bilateral carotid artery stenosis 03/24/2017  . Left subclavian artery occlusion 03/24/2017  . Occlusion of vertebral artery 03/24/2017  . Chest pain 07/27/2016  . Ventricular premature beats 07/27/2016    Current Outpatient Medications on File Prior to Visit  Medication Sig Dispense Refill  . albuterol (PROVENTIL) (2.5 MG/3ML) 0.083% nebulizer solution Take 3 mLs (2.5 mg total) by nebulization every 4 (four) hours as needed for wheezing or shortness of breath. 75 mL 1  . amitriptyline (ELAVIL) 25 MG tablet Take 1 tablet (25 mg total) by mouth at bedtime. 30 tablet 1  . amitriptyline (ELAVIL) 25 MG tablet     . aspirin (GOODSENSE ASPIRIN) 325 MG tablet Take 325 mg by mouth daily.    . benzonatate (TESSALON) 200 MG capsule     . cetirizine (ZYRTEC) 10 MG tablet TAKE 1 TAB(S) ORALLY ONCE (AT BEDTIME) FOR 30 DAY(S)    . cetirizine-pseudoephedrine (ZYRTEC-D) 5-120 MG tablet Take 1 tablet by mouth 2 (two) times daily.     Marland Kitchen estrogens, conjugated, (PREMARIN) 1.25 MG tablet Take 1.25 mg by mouth daily.    . Fluticasone Furoate (ARNUITY ELLIPTA) 100 MCG/ACT AEPB Inhale 1 Dose into the lungs daily. Rinse, gargle, and spit after use. 1 each 3  . meloxicam (MOBIC) 15 MG tablet Take 15 mg by mouth daily.    Marland Kitchen olopatadine (PATANOL) 0.1 % ophthalmic solution Place 1 drop into both eyes 2 (two) times daily.  5 mL 4  . pantoprazole (PROTONIX) 40 MG tablet     . predniSONE (STERAPRED UNI-PAK 21 TAB) 10 MG (21) TBPK tablet Take as directed 21 tablet 0  . rosuvastatin (CRESTOR) 10 MG tablet Take 10 mg by mouth daily.    Marland Kitchen triamcinolone cream (KENALOG) 0.1 % TAKE 1 APPLICATION APPLIED TOPICALLY 3 TIMES A DAY FOR 7 DAY(S)    . VENTOLIN HFA 108 (90 Base) MCG/ACT inhaler     . Vitamin D, Ergocalciferol, (DRISDOL) 1.25 MG (50000 UT) CAPS capsule      Current Facility-Administered Medications on File Prior to Visit  Medication Dose Route Frequency Provider Last Rate Last Dose  . ipratropium-albuterol (DUONEB) 0.5-2.5 (3) MG/3ML nebulizer solution 3 mL  3 mL Nebulization Q6H Padgett, Rae Halsted, MD        Allergies  Allergen Reactions  . Ciprofloxacin Hcl Swelling  . Propofol Other (See Comments)    Objective:  General: Alert and oriented x3 in no acute distress  Dermatology: No open lesions bilateral lower extremities, no webspace macerations, no ecchymosis bilateral, all nails x 10 are well manicured.  Vascular: Dorsalis Pedis and Posterior Tibial pedal pulses faintly palpable, Capillary Fill Time 3 seconds,(+) pedal hair growth bilateral, no edema bilateral lower extremities, Temperature gradient within normal limits with a mild blue hue to the lower extremities like before but feet are warm, these changes improve with elevation.  Neurology: Gross sensation intact via light touch bilateral, however there is subjective burning pain sharp shooting pain worse at toes 1 2 and 3 on left like before.  There is decreased protective sensation on the left as compared to the right there is also a decrease in sharp dull sensation on the left as well as cold temperature sensation on the left as compared to the right.  Musculoskeletal: Mild to moderate tenderness with palpation at lateral ankle at the lateral collateral ligaments and tib-fib syndesmosis on left.  Negative talar tilt, mild pain with tib-fib  stress, mild left ankle instability on left. No pain with calf compression bilateral. Range of motion within normal limits with mild guarding on left ankle.  Reports more mobility with range of motion like before however with the same amount of nerve pain.  Strength within normal limits in all groups bilateral.   Gait: Antalgic gait tennis shoe  Assessment and Plan: Problem List Items Addressed This Visit    None    Visit Diagnoses    Complex regional pain syndrome type 1 of left lower extremity    -  Primary   Injury of left ankle, subsequent encounter       Sprain of tibiofibular ligament of left ankle, subsequent encounter       Left ankle pain, unspecified chronicity       Left foot pain          -Complete examination performed -Re-discussed treatement options for continued pain after injury at work with suspected high ankle sprain/ligament tear with neuritis chronic in nature at this point with discoloration likely from vascular trauma and old injury with symptoms of chronic regional pain syndrome type I -It is my medical opinion that patient's nerve damage as result of her original sprain injury that has now resulted in her having chronic regional pain syndrome patient had an outside nerve study that revealed some nerve damage that was conducted approximately 10 months after her original injury -Awaiting updated nerve conduction test to compare to previous to see if there is any worsening nerve damage that could explain patient's severe symptoms -Awaiting MRI over-read -Advised patient to continue flexeril  -Recommend to continue with rest elevation topical pain creams and rubs like before as tolerated -Continue with shoes as tolerated since cam boot is no longer helping -Patient to remain out of work since she is unable to work due to chronic limb pain; duty paperwork and disability paperwork completed.  Case was discussed with Elizebeth Brooking who will assist with getting her nerve  conduction test approved and MRI overread approved  Landis Martins, DPM

## 2018-10-17 ENCOUNTER — Telehealth: Payer: Self-pay | Admitting: *Deleted

## 2018-10-17 NOTE — Telephone Encounter (Signed)
Called Sherri Arnold - Case Worker phone message states cellphone [phone], and to fax [phone]. Faxed Dr. Leeanne Rio ordered information to Sherri Arnold.

## 2018-10-17 NOTE — Telephone Encounter (Signed)
-----   Message from Landis Martins, [place] sent at 10/13/2018  9:07 PM EDT ----- Regarding: Orders to case manager Case manager Elizebeth Brooking [phone] phone number Wants a copy of the prescription/order for nerve studies (NVC/EMG) Wants a copy of the information/MRI over-read codes and the contact information for [facility] radiology group  Legrand Como will help with getting her nerve study approved and will help with getting her MRI over-read.  Please call him at the number above to have him to assist with these things and provide him with the above information and any copies of the chart notes if he needs them.  Thanks Dr. Cannon Kettle

## 2018-10-17 NOTE — Telephone Encounter (Signed)
Mailed copy of 06/2017 MRI to [facility].

## 2018-10-24 ENCOUNTER — Telehealth: Payer: Self-pay | Admitting: Sports Medicine

## 2018-10-24 NOTE — Telephone Encounter (Signed)
Sherri Arnold called returning a call from the nurse.  His fax number is [phone]

## 2018-10-25 ENCOUNTER — Telehealth: Payer: Self-pay | Admitting: Sports Medicine

## 2018-10-25 NOTE — Telephone Encounter (Signed)
Mailed copy of 07/04/2017 MRI disc printed in PACS to [facility].

## 2018-10-25 NOTE — Telephone Encounter (Signed)
[facility] called stating they had received a request for pts MRI to be burned on a disk from the machine but it has been so long since the MRI that they are no longer on the machine. [facility] called to notify that they can burn the disk from the [facility] and will send it over.

## 2018-10-25 NOTE — Telephone Encounter (Addendum)
I spoke with Mr. Rosann Auerbach states NCV is to be performed with [facility] and okayed by the [employer], and he would have the group fax the results to Dr. Cannon Kettle at my fax [phone]. Reordered copy of MRI disc 07/04/2017 from [facility] [place]. Elizebeth Brooking states can be reached by cellphone [phone].

## 2018-11-10 ENCOUNTER — Other Ambulatory Visit: Payer: Self-pay

## 2018-11-10 ENCOUNTER — Encounter: Payer: Self-pay | Admitting: Sports Medicine

## 2018-11-10 ENCOUNTER — Ambulatory Visit (INDEPENDENT_AMBULATORY_CARE_PROVIDER_SITE_OTHER): Payer: Worker's Compensation | Admitting: Sports Medicine

## 2018-11-10 DIAGNOSIS — M25572 Pain in left ankle and joints of left foot: Secondary | ICD-10-CM

## 2018-11-10 DIAGNOSIS — G90522 Complex regional pain syndrome I of left lower limb: Secondary | ICD-10-CM

## 2018-11-10 DIAGNOSIS — G8929 Other chronic pain: Secondary | ICD-10-CM

## 2018-11-10 DIAGNOSIS — S99912D Unspecified injury of left ankle, subsequent encounter: Secondary | ICD-10-CM | POA: Diagnosis not present

## 2018-11-10 DIAGNOSIS — S93432D Sprain of tibiofibular ligament of left ankle, subsequent encounter: Secondary | ICD-10-CM | POA: Diagnosis not present

## 2018-11-10 NOTE — Progress Notes (Signed)
Subjective: Sherri Arnold is a 48 y.o. female patient who returns to office for follow-up evaluation of left ankle pain.  Patient has a work-related injury that happened on April 16, 2017.  Reports that pain is the same 8 out of 10 no better than last visit.  Currently taking Flexeril using Biofreeze like before with no significant changes in symptoms.  No other pedal complaints noted at this time.  Patient Active Problem List   Diagnosis Date Noted  . Bilateral carotid artery stenosis 03/24/2017  . Left subclavian artery occlusion 03/24/2017  . Occlusion of vertebral artery 03/24/2017  . Chest pain 07/27/2016  . Ventricular premature beats 07/27/2016    Current Outpatient Medications on File Prior to Visit  Medication Sig Dispense Refill  . albuterol (PROVENTIL) (2.5 MG/3ML) 0.083% nebulizer solution Take 3 mLs (2.5 mg total) by nebulization every 4 (four) hours as needed for wheezing or shortness of breath. 75 mL 1  . amitriptyline (ELAVIL) 25 MG tablet Take 1 tablet (25 mg total) by mouth at bedtime. 30 tablet 1  . amitriptyline (ELAVIL) 25 MG tablet     . aspirin (GOODSENSE ASPIRIN) 325 MG tablet Take 325 mg by mouth daily.    . benzonatate (TESSALON) 200 MG capsule     . cetirizine (ZYRTEC) 10 MG tablet TAKE 1 TAB(S) ORALLY ONCE (AT BEDTIME) FOR 30 DAY(S)    . cetirizine-pseudoephedrine (ZYRTEC-D) 5-120 MG tablet Take 1 tablet by mouth 2 (two) times daily.     Marland Kitchen estrogens, conjugated, (PREMARIN) 1.25 MG tablet Take 1.25 mg by mouth daily.    . Fluticasone Furoate (ARNUITY ELLIPTA) 100 MCG/ACT AEPB Inhale 1 Dose into the lungs daily. Rinse, gargle, and spit after use. 1 each 3  . meloxicam (MOBIC) 15 MG tablet Take 15 mg by mouth daily.    Marland Kitchen olopatadine (PATANOL) 0.1 % ophthalmic solution Place 1 drop into both eyes 2 (two) times daily. 5 mL 4  . pantoprazole (PROTONIX) 40 MG tablet     . predniSONE (STERAPRED UNI-PAK 21 TAB) 10 MG (21) TBPK tablet Take as directed 21 tablet 0  .  rosuvastatin (CRESTOR) 10 MG tablet Take 10 mg by mouth daily.    Marland Kitchen triamcinolone cream (KENALOG) 0.1 % TAKE 1 APPLICATION APPLIED TOPICALLY 3 TIMES A DAY FOR 7 DAY(S)    . VENTOLIN HFA 108 (90 Base) MCG/ACT inhaler     . Vitamin D, Ergocalciferol, (DRISDOL) 1.25 MG (50000 UT) CAPS capsule      Current Facility-Administered Medications on File Prior to Visit  Medication Dose Route Frequency Provider Last Rate Last Dose  . ipratropium-albuterol (DUONEB) 0.5-2.5 (3) MG/3ML nebulizer solution 3 mL  3 mL Nebulization Q6H Padgett, Rae Halsted, MD        Allergies  Allergen Reactions  . Ciprofloxacin Hcl Swelling  . Propofol Other (See Comments)    Objective:  General: Alert and oriented x3 in no acute distress  Dermatology: No open lesions bilateral lower extremities, no webspace macerations, no ecchymosis bilateral, all nails x 10 are well manicured.  Vascular: Dorsalis Pedis and Posterior Tibial pedal pulses faintly palpable, Capillary Fill Time 3 seconds,(+) pedal hair growth bilateral, no edema bilateral lower extremities, Temperature gradient decreased with a mild blue hue to the lower extremities like before.  Neurology: Johney Maine sensation intact via light touch bilateral, however there is subjective burning pain sharp shooting over her toes that feels like it is creeping up the leg with decreased temperature sensation on left as compared with the  right  Musculoskeletal: Mild to moderate tenderness with palpation at medial and lateral ankle. No pain with calf compression bilateral. Range of motion within normal limits with mild guarding on left ankle.  Strength within normal limits in all groups bilateral.   Gait: Antalgic gait in bedroom slippers  Assessment and Plan: Problem List Items Addressed This Visit    None    Visit Diagnoses    Complex regional pain syndrome type 1 of left lower extremity    -  Primary   Injury of left ankle, subsequent encounter       Sprain of  tibiofibular ligament of left ankle, subsequent encounter       Chronic pain of left ankle          -Complete examination performed -Re-discussed treatement options for continued pain after injury at work/ [employer] -Awaiting updated nerve conduction test; scheduled for December 20, 2018 -Awaiting MRI over-read -Advised patient to continue flexeril and Biofreeze -Recommend patient to try using heat; heating pack provided -Recommend to continue with rest elevation topical pain creams and rubs like before as tolerated -Continue with comfortable shoes as tolerated -Patient to remain out of work since she is unable to work due to chronic limb pain; duty paperwork and disability paperwork completed for this visit patient to return to office in 4 weeks for re-evaluation. Landis Martins, DPM

## 2018-11-16 ENCOUNTER — Telehealth: Payer: Self-pay | Admitting: *Deleted

## 2018-11-16 NOTE — Telephone Encounter (Signed)
Sherri Arnold - Worker's comp case worker states he had attempted to have pt's case extended, but the case has been closed, this does not effect [facility].

## 2018-12-01 ENCOUNTER — Telehealth: Payer: Self-pay | Admitting: Sports Medicine

## 2018-12-01 NOTE — Telephone Encounter (Signed)
Need office notes for last visits- for patietn to get EMG . Please fax notes to  Fax: [phone]  Atten: Sharee Pimple

## 2018-12-04 NOTE — Telephone Encounter (Signed)
Doing this now.

## 2018-12-15 ENCOUNTER — Other Ambulatory Visit: Payer: Self-pay

## 2018-12-15 ENCOUNTER — Ambulatory Visit (INDEPENDENT_AMBULATORY_CARE_PROVIDER_SITE_OTHER): Admitting: Sports Medicine

## 2018-12-15 DIAGNOSIS — G8929 Other chronic pain: Secondary | ICD-10-CM

## 2018-12-15 DIAGNOSIS — S99912D Unspecified injury of left ankle, subsequent encounter: Secondary | ICD-10-CM | POA: Diagnosis not present

## 2018-12-15 DIAGNOSIS — G90522 Complex regional pain syndrome I of left lower limb: Secondary | ICD-10-CM | POA: Diagnosis not present

## 2018-12-15 DIAGNOSIS — S93432D Sprain of tibiofibular ligament of left ankle, subsequent encounter: Secondary | ICD-10-CM | POA: Diagnosis not present

## 2018-12-15 DIAGNOSIS — M25572 Pain in left ankle and joints of left foot: Secondary | ICD-10-CM

## 2018-12-15 NOTE — Progress Notes (Signed)
Subjective: Sherri Arnold is a 48 y.o. female patient who returns to office for follow-up evaluation of left ankle pain.  Patient has a work-related injury that happened on April 16, 2017.  Reports that pain is the same 8 out of 10 sharp and constant pains with some stinging.  Currently taking Flexeril , meloxicam, and using Biofreeze and heat like before with no significant changes in symptoms except feeling like heat helps some.  No other pedal complaints noted at this time.  Patient Active Problem List   Diagnosis Date Noted  . Bilateral carotid artery stenosis 03/24/2017  . Left subclavian artery occlusion 03/24/2017  . Occlusion of vertebral artery 03/24/2017  . Chest pain 07/27/2016  . Ventricular premature beats 07/27/2016    Current Outpatient Medications on File Prior to Visit  Medication Sig Dispense Refill  . albuterol (PROVENTIL) (2.5 MG/3ML) 0.083% nebulizer solution Take 3 mLs (2.5 mg total) by nebulization every 4 (four) hours as needed for wheezing or shortness of breath. 75 mL 1  . amitriptyline (ELAVIL) 25 MG tablet Take 1 tablet (25 mg total) by mouth at bedtime. 30 tablet 1  . amitriptyline (ELAVIL) 25 MG tablet     . aspirin (GOODSENSE ASPIRIN) 325 MG tablet Take 325 mg by mouth daily.    . benzonatate (TESSALON) 200 MG capsule     . cetirizine (ZYRTEC) 10 MG tablet TAKE 1 TAB(S) ORALLY ONCE (AT BEDTIME) FOR 30 DAY(S)    . cetirizine-pseudoephedrine (ZYRTEC-D) 5-120 MG tablet Take 1 tablet by mouth 2 (two) times daily.     Marland Kitchen estrogens, conjugated, (PREMARIN) 1.25 MG tablet Take 1.25 mg by mouth daily.    . Fluticasone Furoate (ARNUITY ELLIPTA) 100 MCG/ACT AEPB Inhale 1 Dose into the lungs daily. Rinse, gargle, and spit after use. 1 each 3  . meloxicam (MOBIC) 15 MG tablet Take 15 mg by mouth daily.    Marland Kitchen olopatadine (PATANOL) 0.1 % ophthalmic solution Place 1 drop into both eyes 2 (two) times daily. 5 mL 4  . pantoprazole (PROTONIX) 40 MG tablet     . predniSONE  (STERAPRED UNI-PAK 21 TAB) 10 MG (21) TBPK tablet Take as directed 21 tablet 0  . rosuvastatin (CRESTOR) 10 MG tablet Take 10 mg by mouth daily.    Marland Kitchen triamcinolone cream (KENALOG) 0.1 % TAKE 1 APPLICATION APPLIED TOPICALLY 3 TIMES A DAY FOR 7 DAY(S)    . VENTOLIN HFA 108 (90 Base) MCG/ACT inhaler     . Vitamin D, Ergocalciferol, (DRISDOL) 1.25 MG (50000 UT) CAPS capsule      Current Facility-Administered Medications on File Prior to Visit  Medication Dose Route Frequency Provider Last Rate Last Dose  . ipratropium-albuterol (DUONEB) 0.5-2.5 (3) MG/3ML nebulizer solution 3 mL  3 mL Nebulization Q6H Padgett, Rae Halsted, MD        Allergies  Allergen Reactions  . Ciprofloxacin Hcl Swelling  . Propofol Other (See Comments)    Objective:  General: Alert and oriented x3 in no acute distress  Dermatology: No open lesions bilateral lower extremities, no webspace macerations, no ecchymosis bilateral, all nails x 10 are well manicured.  Vascular: Dorsalis Pedis and Posterior Tibial pedal pulses faintly palpable, Capillary Fill Time 3 seconds,(+) pedal hair growth bilateral, no edema bilateral lower extremities, Temperature gradient decreased with a mild blue hue to the lower extremities like before.  Neurology: Johney Maine sensation intact via light touch bilateral, however there is subjective burning pain sharp shooting over her toes that feels like it is creeping  up the leg with decreased temperature sensation on left as compared with the right, toes 1-2 feel numb and toes 3-5 feel tingly.   Musculoskeletal: Mild to moderate tenderness with palpation at medial and lateral ankle with most pain today af distal fibula on left. No pain with calf compression bilateral. Range of motion within normal limits with mild guarding on left ankle.  Strength within normal limits in all groups bilateral.   Gait: Antalgic gait in bedroom slippers  Assessment and Plan: Problem List Items Addressed This Visit     None    Visit Diagnoses    Complex regional pain syndrome type 1 of left lower extremity    -  Primary   Injury of left ankle, subsequent encounter       Sprain of tibiofibular ligament of left ankle, subsequent encounter       Chronic pain of left ankle       Left ankle pain, unspecified chronicity          -Complete examination performed -Re-discussed treatement options for continued pain after injury at work/ CRPS -Awaiting updated nerve conduction test; scheduled for December 20, 2018 like fore -Awaiting MRI over-read; No results available at this time -Advised patient to continue flexeril and Biofreeze as tolerated  -Recommend patient to try using heat as tolerated since it helps -Recommend to continue with rest elevation topical pain creams and rubs like before as tolerated -Continue with comfortable shoes as tolerated -Patient to remain out of work since she is unable to work due to chronic limb pain; duty paperwork and disability paperwork completed again for this visit patient to return to office in 4 weeks for re-evaluation. Landis Martins, DPM

## 2018-12-18 DIAGNOSIS — R221 Localized swelling, mass and lump, neck: Secondary | ICD-10-CM | POA: Insufficient documentation

## 2018-12-18 DIAGNOSIS — H9201 Otalgia, right ear: Secondary | ICD-10-CM | POA: Insufficient documentation

## 2018-12-18 HISTORY — DX: Localized swelling, mass and lump, neck: R22.1

## 2018-12-18 HISTORY — DX: Otalgia, right ear: H92.01

## 2018-12-26 ENCOUNTER — Telehealth: Payer: Self-pay | Admitting: Sports Medicine

## 2018-12-26 ENCOUNTER — Telehealth: Payer: Self-pay

## 2018-12-26 ENCOUNTER — Telehealth: Payer: Self-pay | Admitting: *Deleted

## 2018-12-26 DIAGNOSIS — S93432D Sprain of tibiofibular ligament of left ankle, subsequent encounter: Secondary | ICD-10-CM

## 2018-12-26 DIAGNOSIS — G8929 Other chronic pain: Secondary | ICD-10-CM

## 2018-12-26 DIAGNOSIS — I739 Peripheral vascular disease, unspecified: Secondary | ICD-10-CM

## 2018-12-26 DIAGNOSIS — S99912D Unspecified injury of left ankle, subsequent encounter: Secondary | ICD-10-CM

## 2018-12-26 DIAGNOSIS — M792 Neuralgia and neuritis, unspecified: Secondary | ICD-10-CM

## 2018-12-26 DIAGNOSIS — M25572 Pain in left ankle and joints of left foot: Secondary | ICD-10-CM

## 2018-12-26 DIAGNOSIS — G90522 Complex regional pain syndrome I of left lower limb: Secondary | ICD-10-CM

## 2018-12-26 NOTE — Telephone Encounter (Signed)
Faxed referral, and clinicals for 6 months to [facility].

## 2018-12-26 NOTE — Telephone Encounter (Signed)
Will you let patient know that I have reviewed her nerve studies that she had done on 12-20-18. Nerve studies + for damage at Common peroneal and EDB muscle in foot on left which is a little more advance since previous nerve study. We will consult neurology for follow up to see if they can help with her pain that is related to the nerve damage. Thanks Dr. Cannon Kettle

## 2018-12-26 NOTE — Telephone Encounter (Signed)
-----   Message from Landis Martins, [place] sent at 12/26/2018  7:15 AM EST ----- Regarding: Refer to neurology Refer patient to follow up with Neurology. She had nerve studies at [facility] pain management. Nerve studies + damage at Common peroneal and EDB muscle in foot on left which is a little more advance since previous nerve study. Patient may a good candidate for a nerve stimulator for her pain. Thanks Dr. Cannon Kettle

## 2018-12-26 NOTE — Telephone Encounter (Signed)
Spoke with Pt about nerve study results and the Dr's instructions, Pt stated understanding. Pt questions if there is a specific neurologist that Dr. Cannon Kettle suggests for her to see.

## 2018-12-26 NOTE — Telephone Encounter (Signed)
Marcy Siren will call her and is working on the referral. Val KristenWochele from [employer] can also help with getting patient evaluated for the nerve stimulator. I am not sure if she works with [facility] but she can help with patient to get in with her team of neurologist who can help with the stimulator. Her phone # is [phone] Thanks Dr. Cannon Kettle

## 2019-01-01 NOTE — Telephone Encounter (Signed)
-----   Message from Landis Martins, [place] sent at 12/26/2018 12:23 PM EST ----- Regarding: RE: Refer to neurology No recent MRI Her nerve study results are on Lindsay's desk for scan I reviewed them this morning from [facility] pain mgt ----- Message ----- From: Andres Ege, RN Sent: 12/26/2018   9:05 AM EST To: Landis Martins, DPM Subject: RE: Refer to neurology                         Dr. Cannon Kettle, Do we have a current MRI more recent than 06/2017? I can't find it or Nerve studies. Marcy Siren ----- Message ----- From: Landis Martins, DPM Sent: 12/26/2018   7:15 AM EST To: Andres Ege, RN Subject: Refer to neurology                             Refer patient to follow up with Neurology. She had nerve studies at [facility] pain management. Nerve studies + damage at Common peroneal and EDB muscle in foot on left which is a little more advance since previous nerve study. Patient may a good candidate for a nerve stimulator for her pain. Thanks Dr. Cannon Kettle

## 2019-01-01 NOTE — Telephone Encounter (Signed)
Original OWCP [id number] is not longer active. I placed call to current number [phone] and they are not open until 9:30am.

## 2019-01-02 NOTE — Telephone Encounter (Signed)
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 Faxed referral to [facility].

## 2019-01-02 NOTE — Addendum Note (Signed)
Addended by: Harriett Sine D on: 01/02/2019 10:42 AM   Modules accepted: Orders

## 2019-01-19 ENCOUNTER — Ambulatory Visit (INDEPENDENT_AMBULATORY_CARE_PROVIDER_SITE_OTHER): Admitting: Sports Medicine

## 2019-01-19 ENCOUNTER — Other Ambulatory Visit: Payer: Self-pay

## 2019-01-19 DIAGNOSIS — S99912D Unspecified injury of left ankle, subsequent encounter: Secondary | ICD-10-CM | POA: Diagnosis not present

## 2019-01-19 DIAGNOSIS — M792 Neuralgia and neuritis, unspecified: Secondary | ICD-10-CM

## 2019-01-19 DIAGNOSIS — I739 Peripheral vascular disease, unspecified: Secondary | ICD-10-CM

## 2019-01-19 DIAGNOSIS — G8929 Other chronic pain: Secondary | ICD-10-CM

## 2019-01-19 DIAGNOSIS — M25572 Pain in left ankle and joints of left foot: Secondary | ICD-10-CM

## 2019-01-19 DIAGNOSIS — G90522 Complex regional pain syndrome I of left lower limb: Secondary | ICD-10-CM | POA: Diagnosis not present

## 2019-01-19 DIAGNOSIS — S93432D Sprain of tibiofibular ligament of left ankle, subsequent encounter: Secondary | ICD-10-CM | POA: Diagnosis not present

## 2019-01-19 DIAGNOSIS — M79672 Pain in left foot: Secondary | ICD-10-CM

## 2019-01-19 MED ORDER — IBUPROFEN 800 MG PO TABS: 800.0000 mg | ORAL_TABLET | Freq: Three times a day (TID) | ORAL | 0 refills | Status: DC | PRN

## 2019-01-19 NOTE — Progress Notes (Signed)
Subjective: Sherri Arnold is a 49 y.o. female patient who returns to office for follow-up evaluation of left ankle pain.  Patient has a work-related injury that happened on April 16, 2017.  Reports that pain is the same 8-10 out of 10 sharp and constant pains with some stinging.  Currently taking Flexeril , Advil and Tylenol, meloxicam for her back, and using Biofreeze and heat like before with no significant changes in symptoms.  Patient reports that is not getting better and is also here to discuss her nerve study results as well and plan of care.  No other pedal complaints noted at this time.  Patient Active Problem List   Diagnosis Date Noted  . Bilateral carotid artery stenosis 03/24/2017  . Left subclavian artery occlusion 03/24/2017  . Occlusion of vertebral artery 03/24/2017  . Chest pain 07/27/2016  . Ventricular premature beats 07/27/2016    Current Outpatient Medications on File Prior to Visit  Medication Sig Dispense Refill  . albuterol (PROVENTIL) (2.5 MG/3ML) 0.083% nebulizer solution Take 3 mLs (2.5 mg total) by nebulization every 4 (four) hours as needed for wheezing or shortness of breath. 75 mL 1  . amitriptyline (ELAVIL) 25 MG tablet Take 1 tablet (25 mg total) by mouth at bedtime. 30 tablet 1  . amitriptyline (ELAVIL) 25 MG tablet     . aspirin (GOODSENSE ASPIRIN) 325 MG tablet Take 325 mg by mouth daily.    . benzonatate (TESSALON) 200 MG capsule     . cetirizine (ZYRTEC) 10 MG tablet TAKE 1 TAB(S) ORALLY ONCE (AT BEDTIME) FOR 30 DAY(S)    . cetirizine-pseudoephedrine (ZYRTEC-D) 5-120 MG tablet Take 1 tablet by mouth 2 (two) times daily.     Marland Kitchen estrogens, conjugated, (PREMARIN) 1.25 MG tablet Take 1.25 mg by mouth daily.    . Fluticasone Furoate (ARNUITY ELLIPTA) 100 MCG/ACT AEPB Inhale 1 Dose into the lungs daily. Rinse, gargle, and spit after use. 1 each 3  . meloxicam (MOBIC) 15 MG tablet Take 15 mg by mouth daily.    Marland Kitchen olopatadine (PATANOL) 0.1 % ophthalmic solution  Place 1 drop into both eyes 2 (two) times daily. 5 mL 4  . pantoprazole (PROTONIX) 40 MG tablet     . predniSONE (STERAPRED UNI-PAK 21 TAB) 10 MG (21) TBPK tablet Take as directed 21 tablet 0  . rosuvastatin (CRESTOR) 10 MG tablet Take 10 mg by mouth daily.    Marland Kitchen triamcinolone cream (KENALOG) 0.1 % TAKE 1 APPLICATION APPLIED TOPICALLY 3 TIMES A DAY FOR 7 DAY(S)    . VENTOLIN HFA 108 (90 Base) MCG/ACT inhaler     . Vitamin D, Ergocalciferol, (DRISDOL) 1.25 MG (50000 UT) CAPS capsule      Current Facility-Administered Medications on File Prior to Visit  Medication Dose Route Frequency Provider Last Rate Last Admin  . ipratropium-albuterol (DUONEB) 0.5-2.5 (3) MG/3ML nebulizer solution 3 mL  3 mL Nebulization Q6H Padgett, Rae Halsted, MD        Allergies  Allergen Reactions  . Ciprofloxacin Hcl Swelling  . Propofol Other (See Comments)    Objective:  General: Alert and oriented x3 in no acute distress  Dermatology: No open lesions bilateral lower extremities, no webspace macerations, no ecchymosis bilateral, all nails x 10 are well manicured.  Vascular: Dorsalis Pedis and Posterior Tibial pedal pulses faintly palpable, Capillary Fill Time 3 seconds,(+) pedal hair growth bilateral, no edema bilateral lower extremities, Temperature gradient decreased with a mild blue hue to the lower extremities like before.  Neurology:  Gross sensation intact via light touch bilateral, however there is subjective burning pain sharp shooting over her toes that feels that radiates up her leg on the left.  Musculoskeletal: Mild to moderate tenderness with palpation at medial and lateral ankle with most pain today af distal fibula and lateral ankle ligaments on left. No pain with calf compression bilateral. Range of motion within normal limits with mild guarding on left ankle.  Strength within normal limits in all groups bilateral.   Gait: Antalgic gait in bedroom slippers  Assessment and Plan: Problem  List Items Addressed This Visit    None    Visit Diagnoses    Complex regional pain syndrome type 1 of left lower extremity    -  Primary   Relevant Medications   ibuprofen (ADVIL) 800 MG tablet   Injury of left ankle, subsequent encounter       Sprain of tibiofibular ligament of left ankle, subsequent encounter       Chronic pain of left ankle       Relevant Medications   ibuprofen (ADVIL) 800 MG tablet   Neuritis       PVD (peripheral vascular disease) (HCC)       Left ankle pain, unspecified chronicity       Left foot pain          -Complete examination performed -Re-discussed treatement options for continued pain after injury at work/ CRPS -Nerve studies + damage at Common peroneal and EDB muscle in foot on left which is a little more advance since previous nerve study as demonstrated on 12/20/2018 -Referral made to [facility] for spinal cord neuromodulation for CRPS -Awaiting MRI over-read; No results available at this time -Advised patient to continue flexeril and Biofreeze as tolerated  -Prescribed Motrin 800 mg to see if this will give her some additional relief for her pain -Continue with comfortable shoes as tolerated -Patient to remain out of work since she is unable to work due to chronic limb pain; duty paperwork and disability paperwork completed again for this visit patient to return to office in 4-5 weeks for re-evaluation.  Landis Martins, DPM

## 2019-01-22 ENCOUNTER — Telehealth: Payer: Self-pay | Admitting: *Deleted

## 2019-01-22 DIAGNOSIS — M25572 Pain in left ankle and joints of left foot: Secondary | ICD-10-CM

## 2019-01-22 DIAGNOSIS — M792 Neuralgia and neuritis, unspecified: Secondary | ICD-10-CM

## 2019-01-22 DIAGNOSIS — S99912D Unspecified injury of left ankle, subsequent encounter: Secondary | ICD-10-CM

## 2019-01-22 DIAGNOSIS — S93432D Sprain of tibiofibular ligament of left ankle, subsequent encounter: Secondary | ICD-10-CM

## 2019-01-22 DIAGNOSIS — G8929 Other chronic pain: Secondary | ICD-10-CM

## 2019-01-22 DIAGNOSIS — G90522 Complex regional pain syndrome I of left lower limb: Secondary | ICD-10-CM

## 2019-01-22 NOTE — Telephone Encounter (Signed)
-----   Message from Landis Martins, [place] sent at 01/19/2019  4:31 PM EST ----- Regarding: Rep will stop by Monday Sherri Arnold from [place] will come by on Monday to pick up this patient's demographic sheet, please make this information available for her to pick up.  Sherri Arnold will also drop off some referral forms for the spinal cord stimulator/neuromodulation for [place] to use when we want to send patients to Dr. Maryjean Ka neurosurgery for spinal cord implants. Thanks  Dr. Cannon Kettle

## 2019-01-22 NOTE — Addendum Note (Signed)
Addended by: Harriett Sine D on: 01/22/2019 12:10 PM   Modules accepted: Orders

## 2019-01-22 NOTE — Telephone Encounter (Signed)
Called [facility] for copy of NCV with EMG to be faxed to [phone].

## 2019-01-22 NOTE — Telephone Encounter (Signed)
[employer] came to the office to pick up the pt's demographics.

## 2019-01-22 NOTE — Telephone Encounter (Signed)
Faxed required form, LOV clinicals, demographics to [facility].

## 2019-02-07 DIAGNOSIS — R1032 Left lower quadrant pain: Secondary | ICD-10-CM | POA: Insufficient documentation

## 2019-02-07 HISTORY — DX: Left lower quadrant pain: R10.32

## 2019-02-14 ENCOUNTER — Other Ambulatory Visit: Payer: Self-pay | Admitting: Surgery

## 2019-02-14 DIAGNOSIS — R109 Unspecified abdominal pain: Secondary | ICD-10-CM

## 2019-02-19 ENCOUNTER — Telehealth: Payer: Self-pay | Admitting: *Deleted

## 2019-02-19 NOTE — Telephone Encounter (Signed)
Faxed required form, clinicals and demographics to [facility] with stamp for [employer].

## 2019-02-21 ENCOUNTER — Ambulatory Visit
Admission: RE | Admit: 2019-02-21 | Discharge: 2019-02-21 | Disposition: A | Discharge status: Home / Self Care | Payer: 59 | Source: Ambulatory Visit | Attending: Surgery | Admitting: Surgery

## 2019-02-21 ENCOUNTER — Other Ambulatory Visit: Payer: Self-pay

## 2019-02-21 DIAGNOSIS — R109 Unspecified abdominal pain: Secondary | ICD-10-CM

## 2019-02-21 MED ORDER — IOPAMIDOL (ISOVUE-370) INJECTION 76%
75.0000 mL | Freq: Once | INTRAVENOUS | Status: AC | PRN
  Administered 2019-02-21: 75 mL via INTRAVENOUS

## 2019-02-23 ENCOUNTER — Other Ambulatory Visit: Payer: Self-pay

## 2019-02-23 ENCOUNTER — Ambulatory Visit (INDEPENDENT_AMBULATORY_CARE_PROVIDER_SITE_OTHER): Admitting: Sports Medicine

## 2019-02-23 DIAGNOSIS — I739 Peripheral vascular disease, unspecified: Secondary | ICD-10-CM

## 2019-02-23 DIAGNOSIS — S93432D Sprain of tibiofibular ligament of left ankle, subsequent encounter: Secondary | ICD-10-CM

## 2019-02-23 DIAGNOSIS — M792 Neuralgia and neuritis, unspecified: Secondary | ICD-10-CM

## 2019-02-23 DIAGNOSIS — S99912D Unspecified injury of left ankle, subsequent encounter: Secondary | ICD-10-CM | POA: Diagnosis not present

## 2019-02-23 DIAGNOSIS — M25572 Pain in left ankle and joints of left foot: Secondary | ICD-10-CM

## 2019-02-23 DIAGNOSIS — G8929 Other chronic pain: Secondary | ICD-10-CM

## 2019-02-23 DIAGNOSIS — G90522 Complex regional pain syndrome I of left lower limb: Secondary | ICD-10-CM | POA: Diagnosis not present

## 2019-02-23 NOTE — Progress Notes (Signed)
Subjective: PATREECE COSTEN is a 49 y.o. female patient who returns to office for follow-up evaluation of left ankle pain.  Patient has a work-related injury that happened on April 16, 2017.  Reports that pain is the same 8-10 out of 10 sharp and constant pains with some stinging like before and had a really bad episode of pain earlier this week.  Currently taking Flexeril and Motrin and using Biofreeze and heat like before with no significant changes in symptoms.  Patient reports that she got a letter from work comp about at [employer] and is in the process of filing for disability.  No other pedal complaints noted at this time.  Patient Active Problem List   Diagnosis Date Noted  . Bilateral carotid artery stenosis 03/24/2017  . Left subclavian artery occlusion 03/24/2017  . Occlusion of vertebral artery 03/24/2017  . Chest pain 07/27/2016  . Ventricular premature beats 07/27/2016    Current Outpatient Medications on File Prior to Visit  Medication Sig Dispense Refill  . albuterol (PROVENTIL) (2.5 MG/3ML) 0.083% nebulizer solution Take 3 mLs (2.5 mg total) by nebulization every 4 (four) hours as needed for wheezing or shortness of breath. 75 mL 1  . amitriptyline (ELAVIL) 25 MG tablet Take 1 tablet (25 mg total) by mouth at bedtime. 30 tablet 1  . amitriptyline (ELAVIL) 25 MG tablet     . aspirin (GOODSENSE ASPIRIN) 325 MG tablet Take 325 mg by mouth daily.    . benzonatate (TESSALON) 200 MG capsule     . cetirizine (ZYRTEC) 10 MG tablet TAKE 1 TAB(S) ORALLY ONCE (AT BEDTIME) FOR 30 DAY(S)    . cetirizine-pseudoephedrine (ZYRTEC-D) 5-120 MG tablet Take 1 tablet by mouth 2 (two) times daily.     Marland Kitchen estrogens, conjugated, (PREMARIN) 1.25 MG tablet Take 1.25 mg by mouth daily.    . Fluticasone Furoate (ARNUITY ELLIPTA) 100 MCG/ACT AEPB Inhale 1 Dose into the lungs daily. Rinse, gargle, and spit after use. 1 each 3  . ibuprofen (ADVIL) 800 MG tablet Take 1 tablet (800 mg total) by mouth every 8  (eight) hours as needed. 30 tablet 0  . meloxicam (MOBIC) 15 MG tablet Take 15 mg by mouth daily.    Marland Kitchen olopatadine (PATANOL) 0.1 % ophthalmic solution Place 1 drop into both eyes 2 (two) times daily. 5 mL 4  . pantoprazole (PROTONIX) 40 MG tablet     . predniSONE (STERAPRED UNI-PAK 21 TAB) 10 MG (21) TBPK tablet Take as directed 21 tablet 0  . rosuvastatin (CRESTOR) 10 MG tablet Take 10 mg by mouth daily.    Marland Kitchen triamcinolone cream (KENALOG) 0.1 % TAKE 1 APPLICATION APPLIED TOPICALLY 3 TIMES A DAY FOR 7 DAY(S)    . VENTOLIN HFA 108 (90 Base) MCG/ACT inhaler     . Vitamin D, Ergocalciferol, (DRISDOL) 1.25 MG (50000 UT) CAPS capsule      Current Facility-Administered Medications on File Prior to Visit  Medication Dose Route Frequency Provider Last Rate Last Admin  . ipratropium-albuterol (DUONEB) 0.5-2.5 (3) MG/3ML nebulizer solution 3 mL  3 mL Nebulization Q6H Padgett, Rae Halsted, MD        Allergies  Allergen Reactions  . Ciprofloxacin Hcl Swelling  . Propofol Other (See Comments)    Objective:  General: Alert and oriented x3 in no acute distress  Dermatology: No open lesions bilateral lower extremities, no webspace macerations, no ecchymosis bilateral, all nails x 10 are well manicured.  Vascular: Dorsalis Pedis and Posterior Tibial pedal pulses faintly  palpable, Capillary Fill Time 3 seconds,(+) pedal hair growth bilateral, no edema bilateral lower extremities, Temperature gradient decreased with a mild blue hue to the lower extremities like before.  Neurology: Gross sensation intact via light touch bilateral, however there is subjective burning pain sharp shooting over her toes that feels that radiates up her leg on the left.  Musculoskeletal: Mild to moderate tenderness with palpation at medial and lateral ankle with most pain today af distal fibula and lateral ankle ligaments on left like prior. No pain with calf compression bilateral. Range of motion within normal limits with  mild guarding on left ankle like previous  Strength within normal limits in all groups bilateral.   Gait: Antalgic gait in bedroom slippers  Assessment and Plan: Problem List Items Addressed This Visit    None    Visit Diagnoses    Complex regional pain syndrome type 1 of left lower extremity    -  Primary   Injury of left ankle, subsequent encounter       Sprain of tibiofibular ligament of left ankle, subsequent encounter       Chronic pain of left ankle       Neuritis       PVD (peripheral vascular disease) (HCC)          -Complete examination performed -Re-discussed treatement options for continued pain after injury at work/ CRPS -Awaiting referal made to [facility] for spinal cord neuromodulation for CRPS -Advised patient to continue flexeril and Biofreeze as tolerated  -Continue with Motrin 800 mg PRN -Continue with comfortable shoes as tolerated -Patient to remain out of work since she is unable to work due to chronic limb pain; duty paperwork and disability paperwork completed again for this visit like prior -Patient is scheduled to see Ortho on 2/26 for another opinion per work comp and reports that she is in the process of filing for disability   patient to return to office in 4-5 weeks for re-evaluation.  Landis Martins, DPM

## 2019-02-28 DIAGNOSIS — G5772 Causalgia of left lower limb: Secondary | ICD-10-CM | POA: Insufficient documentation

## 2019-02-28 HISTORY — DX: Causalgia of left lower limb: G57.72

## 2019-03-23 ENCOUNTER — Telehealth: Payer: Self-pay | Admitting: Sports Medicine

## 2019-03-23 NOTE — Telephone Encounter (Signed)
Patient presented to office and brought a Social Security disability evaluation form for me to complete on her behalf detailing her work limitations from the offices of [employer].  Paperwork was completed see scanned documents in the media section of this patient's chart. -Dr. Cannon Kettle

## 2019-03-30 ENCOUNTER — Encounter: Payer: Self-pay | Admitting: Sports Medicine

## 2019-03-30 ENCOUNTER — Ambulatory Visit (INDEPENDENT_AMBULATORY_CARE_PROVIDER_SITE_OTHER): Payer: Worker's Compensation | Admitting: Sports Medicine

## 2019-03-30 ENCOUNTER — Other Ambulatory Visit: Payer: Self-pay

## 2019-03-30 DIAGNOSIS — G90522 Complex regional pain syndrome I of left lower limb: Secondary | ICD-10-CM

## 2019-03-30 DIAGNOSIS — M25572 Pain in left ankle and joints of left foot: Secondary | ICD-10-CM

## 2019-03-30 DIAGNOSIS — S93432D Sprain of tibiofibular ligament of left ankle, subsequent encounter: Secondary | ICD-10-CM

## 2019-03-30 DIAGNOSIS — I739 Peripheral vascular disease, unspecified: Secondary | ICD-10-CM

## 2019-03-30 DIAGNOSIS — M79672 Pain in left foot: Secondary | ICD-10-CM

## 2019-03-30 DIAGNOSIS — S99912D Unspecified injury of left ankle, subsequent encounter: Secondary | ICD-10-CM

## 2019-03-30 DIAGNOSIS — G8929 Other chronic pain: Secondary | ICD-10-CM

## 2019-03-30 DIAGNOSIS — M792 Neuralgia and neuritis, unspecified: Secondary | ICD-10-CM

## 2019-03-30 NOTE — Progress Notes (Signed)
Subjective: Sherri Arnold is a 49 y.o. female patient who returns to office for follow-up evaluation of left ankle pain.  Patient has a work-related injury that happened on April 16, 2017.  Reports that pain is the same 8-10 out of 10 sharp and constant pains no relief reports that she went to the Ortho doctor who was not pleasant who referred her to another Ortho doctor, reports that she still has to get her Psych test for the neurologist.  No other pedal complaints noted at this time.  Patient Active Problem List   Diagnosis Date Noted  . Bilateral carotid artery stenosis 03/24/2017  . Left subclavian artery occlusion 03/24/2017  . Occlusion of vertebral artery 03/24/2017  . Chest pain 07/27/2016  . Ventricular premature beats 07/27/2016    Current Outpatient Medications on File Prior to Visit  Medication Sig Dispense Refill  . albuterol (PROVENTIL) (2.5 MG/3ML) 0.083% nebulizer solution Take 3 mLs (2.5 mg total) by nebulization every 4 (four) hours as needed for wheezing or shortness of breath. 75 mL 1  . amitriptyline (ELAVIL) 25 MG tablet Take 1 tablet (25 mg total) by mouth at bedtime. 30 tablet 1  . amitriptyline (ELAVIL) 25 MG tablet     . aspirin (GOODSENSE ASPIRIN) 325 MG tablet Take 325 mg by mouth daily.    . benzonatate (TESSALON) 200 MG capsule     . cetirizine (ZYRTEC) 10 MG tablet TAKE 1 TAB(S) ORALLY ONCE (AT BEDTIME) FOR 30 DAY(S)    . cetirizine-pseudoephedrine (ZYRTEC-D) 5-120 MG tablet Take 1 tablet by mouth 2 (two) times daily.     Marland Kitchen estrogens, conjugated, (PREMARIN) 1.25 MG tablet Take 1.25 mg by mouth daily.    . Fluticasone Furoate (ARNUITY ELLIPTA) 100 MCG/ACT AEPB Inhale 1 Dose into the lungs daily. Rinse, gargle, and spit after use. 1 each 3  . ibuprofen (ADVIL) 800 MG tablet Take 1 tablet (800 mg total) by mouth every 8 (eight) hours as needed. 30 tablet 0  . meloxicam (MOBIC) 15 MG tablet Take 15 mg by mouth daily.    Marland Kitchen olopatadine (PATANOL) 0.1 % ophthalmic  solution Place 1 drop into both eyes 2 (two) times daily. 5 mL 4  . pantoprazole (PROTONIX) 40 MG tablet     . predniSONE (STERAPRED UNI-PAK 21 TAB) 10 MG (21) TBPK tablet Take as directed 21 tablet 0  . rosuvastatin (CRESTOR) 10 MG tablet Take 10 mg by mouth daily.    Marland Kitchen triamcinolone cream (KENALOG) 0.1 % TAKE 1 APPLICATION APPLIED TOPICALLY 3 TIMES A DAY FOR 7 DAY(S)    . VENTOLIN HFA 108 (90 Base) MCG/ACT inhaler     . Vitamin D, Ergocalciferol, (DRISDOL) 1.25 MG (50000 UT) CAPS capsule      Current Facility-Administered Medications on File Prior to Visit  Medication Dose Route Frequency Provider Last Rate Last Admin  . ipratropium-albuterol (DUONEB) 0.5-2.5 (3) MG/3ML nebulizer solution 3 mL  3 mL Nebulization Q6H Padgett, Rae Halsted, MD        Allergies  Allergen Reactions  . Ciprofloxacin Hcl Swelling  . Propofol Other (See Comments)    Objective:  General: Alert and oriented x3 in no acute distress  Dermatology: No open lesions bilateral lower extremities, no webspace macerations, no ecchymosis bilateral, all nails x 10 are well manicured.  Vascular: Dorsalis Pedis and Posterior Tibial pedal pulses faintly palpable, Capillary Fill Time 3 seconds,(+) pedal hair growth bilateral, no edema bilateral lower extremities, Temperature gradient decreased with a mild blue hue to the  lower extremities like before.  Neurology: Gross sensation intact via light touch bilateral, however there is subjective burning pain sharp shooting over her toes that feels that radiates up her leg on the left.  Musculoskeletal: Mild to moderate tenderness with palpation at medial and lateral ankle with most pain today af distal fibula and lateral ankle ligaments on left like prior with no change. No pain with calf compression bilateral. Range of motion within normal limits with mild guarding on left ankle like previous  Strength within normal limits in all groups bilateral.   Gait: Antalgic gait in  bedroom slippers  Assessment and Plan: Problem List Items Addressed This Visit    None    Visit Diagnoses    Complex regional pain syndrome type 1 of left lower extremity    -  Primary   Injury of left ankle, subsequent encounter       Sprain of tibiofibular ligament of left ankle, subsequent encounter       Chronic pain of left ankle       Neuritis       PVD (peripheral vascular disease) (HCC)       Left ankle pain, unspecified chronicity       Left foot pain          -Complete examination performed -Duty paperwork completed  -Re-discussed treatement options for continued pain after injury at work/ CRPS -Awaiting for psych referal to meet the requirements for [facility] for spinal cord neuromodulation for CRPS/Neurologist -Recommend to consider Chiropractic care as well for her pain -Advised patient to continue with flexeril and Biofreeze as tolerated like before -Continue with Motrin 800 mg PRN like before -Continue with comfortable shoes as tolerated like before  patient to return to office in 4-5 weeks for re-evaluation.  Landis Martins, DPM

## 2019-04-03 ENCOUNTER — Telehealth: Payer: Self-pay | Admitting: *Deleted

## 2019-04-03 NOTE — Telephone Encounter (Signed)
I reviewed [employer] and [facility] [employer], [place], [place], [place], [phone] had 5 out of 5 stars. I left a message with the above information and that we do not routinely and refer to chiropractors.

## 2019-04-03 NOTE — Telephone Encounter (Signed)
-----   Message from Landis Martins, [place] sent at 04/02/2019 10:05 AM EDT ----- Regarding: RE: Chiropractic care Not sure but if they offer chiropractic care it may be an option for the patient. She just wanted a name of a place that she can go to Thanks ----- Message ----- From: Andres Ege, RN Sent: 04/02/2019   8:42 AM EDT To: Landis Martins, DPM Subject: RE: Chiropractic care                          Dr. Cannon Kettle, would [facility] be appropriate? Marcy Siren ----- Message ----- From: Landis Martins, DPM Sent: 03/30/2019   8:53 PM EDT To: Andres Ege, RN Subject: Chiropractic care                              Val Do we have anyone that we refer patients to for Chiropractic care? Thanks Dr. Cannon Kettle

## 2019-04-28 ENCOUNTER — Encounter: Payer: Self-pay | Admitting: Sports Medicine

## 2019-04-28 NOTE — Progress Notes (Signed)
Patient brought a letter from her attorney, Freddy Finner with a request for me to review her most recent work-comp second opinion evaluation performed by Dr. Amedeo Plenty on 03/09/2019.  My specific objections to the this evaluation and explanation has been written as attached in letter for this encounter. -Dr. Cannon Kettle

## 2019-05-10 ENCOUNTER — Encounter: Payer: Self-pay | Admitting: Sports Medicine

## 2019-05-10 ENCOUNTER — Other Ambulatory Visit: Payer: Self-pay

## 2019-05-10 ENCOUNTER — Ambulatory Visit (INDEPENDENT_AMBULATORY_CARE_PROVIDER_SITE_OTHER): Admitting: Sports Medicine

## 2019-05-10 DIAGNOSIS — G90522 Complex regional pain syndrome I of left lower limb: Secondary | ICD-10-CM

## 2019-05-10 DIAGNOSIS — G5772 Causalgia of left lower limb: Secondary | ICD-10-CM

## 2019-05-10 DIAGNOSIS — M25572 Pain in left ankle and joints of left foot: Secondary | ICD-10-CM

## 2019-05-10 DIAGNOSIS — S99912S Unspecified injury of left ankle, sequela: Secondary | ICD-10-CM | POA: Diagnosis not present

## 2019-05-10 DIAGNOSIS — G8929 Other chronic pain: Secondary | ICD-10-CM

## 2019-05-10 DIAGNOSIS — S93432S Sprain of tibiofibular ligament of left ankle, sequela: Secondary | ICD-10-CM | POA: Diagnosis not present

## 2019-05-10 DIAGNOSIS — M79672 Pain in left foot: Secondary | ICD-10-CM

## 2019-05-10 DIAGNOSIS — I739 Peripheral vascular disease, unspecified: Secondary | ICD-10-CM

## 2019-05-10 NOTE — Progress Notes (Signed)
Subjective: Sherri Arnold is a 49 y.o. female patient who returns to office for follow-up evaluation of left ankle pain.  Patient has a work-related injury that happened on April 16, 2017 and is here for follow up.  Reports that pain is same with periods of increased tingling that radiates up the left leg. Reports that she remembers having problems with her nerves immediately after her injury in 2019.  No other pedal complaints noted at this time.  Patient Active Problem List   Diagnosis Date Noted  . Bilateral carotid artery stenosis 03/24/2017  . Left subclavian artery occlusion 03/24/2017  . Occlusion of vertebral artery 03/24/2017  . Chest pain 07/27/2016  . Ventricular premature beats 07/27/2016    Current Outpatient Medications on File Prior to Visit  Medication Sig Dispense Refill  . albuterol (PROVENTIL) (2.5 MG/3ML) 0.083% nebulizer solution Take 3 mLs (2.5 mg total) by nebulization every 4 (four) hours as needed for wheezing or shortness of breath. 75 mL 1  . amitriptyline (ELAVIL) 25 MG tablet Take 1 tablet (25 mg total) by mouth at bedtime. 30 tablet 1  . amitriptyline (ELAVIL) 25 MG tablet     . aspirin (GOODSENSE ASPIRIN) 325 MG tablet Take 325 mg by mouth daily.    . benzonatate (TESSALON) 200 MG capsule     . cetirizine (ZYRTEC) 10 MG tablet TAKE 1 TAB(S) ORALLY ONCE (AT BEDTIME) FOR 30 DAY(S)    . cetirizine-pseudoephedrine (ZYRTEC-D) 5-120 MG tablet Take 1 tablet by mouth 2 (two) times daily.     Marland Kitchen estrogens, conjugated, (PREMARIN) 1.25 MG tablet Take 1.25 mg by mouth daily.    . Fluticasone Furoate (ARNUITY ELLIPTA) 100 MCG/ACT AEPB Inhale 1 Dose into the lungs daily. Rinse, gargle, and spit after use. 1 each 3  . ibuprofen (ADVIL) 800 MG tablet Take 1 tablet (800 mg total) by mouth every 8 (eight) hours as needed. 30 tablet 0  . meloxicam (MOBIC) 15 MG tablet Take 15 mg by mouth daily.    Marland Kitchen olopatadine (PATANOL) 0.1 % ophthalmic solution Place 1 drop into both eyes 2  (two) times daily. 5 mL 4  . pantoprazole (PROTONIX) 40 MG tablet     . predniSONE (STERAPRED UNI-PAK 21 TAB) 10 MG (21) TBPK tablet Take as directed 21 tablet 0  . rosuvastatin (CRESTOR) 10 MG tablet Take 10 mg by mouth daily.    Marland Kitchen triamcinolone cream (KENALOG) 0.1 % TAKE 1 APPLICATION APPLIED TOPICALLY 3 TIMES A DAY FOR 7 DAY(S)    . VENTOLIN HFA 108 (90 Base) MCG/ACT inhaler     . Vitamin D, Ergocalciferol, (DRISDOL) 1.25 MG (50000 UT) CAPS capsule      Current Facility-Administered Medications on File Prior to Visit  Medication Dose Route Frequency Provider Last Rate Last Admin  . ipratropium-albuterol (DUONEB) 0.5-2.5 (3) MG/3ML nebulizer solution 3 mL  3 mL Nebulization Q6H Padgett, Rae Halsted, MD        Allergies  Allergen Reactions  . Ciprofloxacin Hcl Swelling  . Propofol Other (See Comments)    Objective:  General: Alert and oriented x3 in no acute distress  Dermatology: No open lesions bilateral lower extremities, no webspace macerations, no ecchymosis bilateral, all nails x 10 are well manicured.  Vascular: Dorsalis Pedis and Posterior Tibial pedal pulses faintly palpable, Capillary Fill Time 3 seconds,(+) pedal hair growth bilateral, no edema bilateral lower extremities, Temperature gradient decreased with a mild blue hue to the lower extremities like before with most involvement on left  Neurology:  Gross sensation intact via light touch bilateral, however there is subjective burning pain sharp shooting over her toes that feels that radiates up her leg on the left to level of knee like before that is getting worse. +tinel at CP nerve distrubution.   Musculoskeletal: Mild to moderate tenderness with palpation at lateral ankle>medial ankle with most pain today af distal fibula and lateral ankle ligaments on left like prior with no change. No pain with calf compression bilateral. Range of motion within normal limits with mild guarding on left ankle like previous.  Strength  within normal limits in all groups bilateral with subjective weakness and concern for guarding vs early muscle involvement/foot drop. .   Gait: Antalgic gait in bedroom slippers  Assessment and Plan: Problem List Items Addressed This Visit    None    Visit Diagnoses    Complex regional pain syndrome type 1 of left lower extremity    -  Primary   Complex regional pain syndrome type 2 of left lower extremity       Injury of left ankle, sequela       Sprain of tibiofibular ligament of left ankle, sequela       Chronic pain of left ankle       PVD (peripheral vascular disease) (HCC)       Left foot pain          -Complete examination performed -Duty paperwork completed  -Re-discussed treatement options for continued pain after injury at work/ CRPS -To see if this will offer her some relief a nerve block was performed using 9cc of 1% lidocaine and 0.5% marcaine at fibular neck without incident -Advised patient to monitor her symptoms while block is in place and to keep a pain journal -Awaiting neuromodulation for CRPS/Neurologist treatment  -Advised patient to continue with flexeril and Biofreeze as tolerated like before -Continue with comfortable shoes as tolerated like before and cane for stability  -Work duty paperwork and Regulatory affairs officer paper completed for patient  patient to return to office in 4-5 weeks for re-evaluation.  Landis Martins, DPM

## 2019-06-07 ENCOUNTER — Other Ambulatory Visit: Payer: Self-pay

## 2019-06-07 ENCOUNTER — Ambulatory Visit (INDEPENDENT_AMBULATORY_CARE_PROVIDER_SITE_OTHER): Admitting: Sports Medicine

## 2019-06-07 ENCOUNTER — Encounter: Payer: Self-pay | Admitting: Sports Medicine

## 2019-06-07 DIAGNOSIS — G90522 Complex regional pain syndrome I of left lower limb: Secondary | ICD-10-CM

## 2019-06-07 DIAGNOSIS — S93432S Sprain of tibiofibular ligament of left ankle, sequela: Secondary | ICD-10-CM

## 2019-06-07 DIAGNOSIS — S99912S Unspecified injury of left ankle, sequela: Secondary | ICD-10-CM | POA: Diagnosis not present

## 2019-06-07 DIAGNOSIS — G5772 Causalgia of left lower limb: Secondary | ICD-10-CM

## 2019-06-07 DIAGNOSIS — G8929 Other chronic pain: Secondary | ICD-10-CM

## 2019-06-07 DIAGNOSIS — I739 Peripheral vascular disease, unspecified: Secondary | ICD-10-CM

## 2019-06-07 DIAGNOSIS — M25572 Pain in left ankle and joints of left foot: Secondary | ICD-10-CM

## 2019-06-07 NOTE — Progress Notes (Signed)
Subjective: Sherri Arnold is a 49 y.o. female patient who returns to office for follow-up evaluation of left ankle pain.  Patient had a ankle/work-related injury on April 16, 2017.  Patient also last visit had a local common peroneal nerve block performed patient reports that for the first 8 hours she had no pain but ever severe weakness in the leg unable to walk or stand but there is no pain however after the numbing medicine wore off pain was excruciating increased and numbness tingling burning pain to the foot and ankle all the way up to the knee and does not want to have this checked again due to the rebound pain afterwards.  Patient reports that she keep a pain journal but left it at home.  No other pedal complaints noted at this time.  Patient Active Problem List   Diagnosis Date Noted  . Bilateral carotid artery stenosis 03/24/2017  . Left subclavian artery occlusion 03/24/2017  . Occlusion of vertebral artery 03/24/2017  . Chest pain 07/27/2016  . Ventricular premature beats 07/27/2016    Current Outpatient Medications on File Prior to Visit  Medication Sig Dispense Refill  . albuterol (PROVENTIL) (2.5 MG/3ML) 0.083% nebulizer solution Take 3 mLs (2.5 mg total) by nebulization every 4 (four) hours as needed for wheezing or shortness of breath. 75 mL 1  . amitriptyline (ELAVIL) 25 MG tablet Take 1 tablet (25 mg total) by mouth at bedtime. 30 tablet 1  . amitriptyline (ELAVIL) 25 MG tablet     . aspirin (GOODSENSE ASPIRIN) 325 MG tablet Take 325 mg by mouth daily.    . benzonatate (TESSALON) 200 MG capsule     . cetirizine (ZYRTEC) 10 MG tablet TAKE 1 TAB(S) ORALLY ONCE (AT BEDTIME) FOR 30 DAY(S)    . cetirizine-pseudoephedrine (ZYRTEC-D) 5-120 MG tablet Take 1 tablet by mouth 2 (two) times daily.     Marland Kitchen estrogens, conjugated, (PREMARIN) 1.25 MG tablet Take 1.25 mg by mouth daily.    . Fluticasone Furoate (ARNUITY ELLIPTA) 100 MCG/ACT AEPB Inhale 1 Dose into the lungs daily. Rinse,  gargle, and spit after use. 1 each 3  . ibuprofen (ADVIL) 800 MG tablet Take 1 tablet (800 mg total) by mouth every 8 (eight) hours as needed. 30 tablet 0  . meloxicam (MOBIC) 15 MG tablet Take 15 mg by mouth daily.    Marland Kitchen olopatadine (PATANOL) 0.1 % ophthalmic solution Place 1 drop into both eyes 2 (two) times daily. 5 mL 4  . pantoprazole (PROTONIX) 40 MG tablet     . predniSONE (STERAPRED UNI-PAK 21 TAB) 10 MG (21) TBPK tablet Take as directed 21 tablet 0  . rosuvastatin (CRESTOR) 10 MG tablet Take 10 mg by mouth daily.    Marland Kitchen triamcinolone cream (KENALOG) 0.1 % TAKE 1 APPLICATION APPLIED TOPICALLY 3 TIMES A DAY FOR 7 DAY(S)    . VENTOLIN HFA 108 (90 Base) MCG/ACT inhaler     . Vitamin D, Ergocalciferol, (DRISDOL) 1.25 MG (50000 UT) CAPS capsule      Current Facility-Administered Medications on File Prior to Visit  Medication Dose Route Frequency Provider Last Rate Last Admin  . ipratropium-albuterol (DUONEB) 0.5-2.5 (3) MG/3ML nebulizer solution 3 mL  3 mL Nebulization Q6H Padgett, Rae Halsted, MD        Allergies  Allergen Reactions  . Ciprofloxacin Hcl Swelling  . Propofol Other (See Comments)    Objective:  General: Alert and oriented x3 in no acute distress  Dermatology: No open lesions bilateral lower extremities,  no webspace macerations, no ecchymosis bilateral, all nails x 10 are well manicured.  Vascular: Dorsalis Pedis and Posterior Tibial pedal pulses faintly palpable, Capillary Fill Time 3 seconds,(+) pedal hair growth bilateral, trace edema left lateral foot and ankle.  Neurology: Johney Maine sensation intact via light touch bilateral.  Positive Valleuix sign deep peroneal nerve distribution and Tinel's sign at the sural nerve branch of the left foot and ankle.  Musculoskeletal: Mild to moderate tenderness with palpation at lateral ankle>medial ankle with most pain today af distal fibula and lateral ankle ligaments on left like prior. No pain with calf compression bilateral.  Range of motion within normal limits with mild guarding on left ankle like previous.  Strength within normal limits in all groups bilateral with subjective weakness like before with Guarding versus early foot drop.  Gait: Antalgic gait in bedroom slippers  Assessment and Plan: Problem List Items Addressed This Visit    None    Visit Diagnoses    Complex regional pain syndrome type 1 of left lower extremity    -  Primary   Complex regional pain syndrome type 2 of left lower extremity       Injury of left ankle, sequela       Sprain of tibiofibular ligament of left ankle, sequela       Chronic pain of left ankle       PVD (peripheral vascular disease) (HCC)          -Complete examination performed -Duty paperwork completed  -Discussed again with patient treatement options for continued pain after injury at work/ CRPS -No reinjection or nerve block performed at this visit due to rebound pain after last injection -Awaiting psych eval scheduled for June 17 -Awaiting neuromodulation for CRPS/Neurologist treatment  -Advised patient to continue with flexeril and Biofreeze as tolerated like before -Continue with comfortable shoes as tolerated like before and cane for stability  -Handicap placard renewed -Return to office for follow-up evaluation in 4 to 5 weeks or sooner if problems or issues arise.  Landis Martins, DPM

## 2019-06-28 ENCOUNTER — Encounter: Payer: Self-pay | Admitting: Psychology

## 2019-07-05 ENCOUNTER — Encounter: Payer: Self-pay | Admitting: Sports Medicine

## 2019-07-05 ENCOUNTER — Other Ambulatory Visit: Payer: Self-pay

## 2019-07-05 ENCOUNTER — Ambulatory Visit (INDEPENDENT_AMBULATORY_CARE_PROVIDER_SITE_OTHER): Admitting: Sports Medicine

## 2019-07-05 DIAGNOSIS — G8929 Other chronic pain: Secondary | ICD-10-CM

## 2019-07-05 DIAGNOSIS — M79672 Pain in left foot: Secondary | ICD-10-CM

## 2019-07-05 DIAGNOSIS — S99912S Unspecified injury of left ankle, sequela: Secondary | ICD-10-CM

## 2019-07-05 DIAGNOSIS — G5772 Causalgia of left lower limb: Secondary | ICD-10-CM

## 2019-07-05 DIAGNOSIS — I739 Peripheral vascular disease, unspecified: Secondary | ICD-10-CM

## 2019-07-05 DIAGNOSIS — G90522 Complex regional pain syndrome I of left lower limb: Secondary | ICD-10-CM

## 2019-07-05 DIAGNOSIS — S93432S Sprain of tibiofibular ligament of left ankle, sequela: Secondary | ICD-10-CM | POA: Diagnosis not present

## 2019-07-05 MED ORDER — CYCLOBENZAPRINE HCL 10 MG PO TABS: 10.0000 mg | ORAL_TABLET | Freq: Three times a day (TID) | ORAL | 0 refills | Status: DC | PRN

## 2019-07-05 MED ORDER — IBUPROFEN 800 MG PO TABS: 800.0000 mg | ORAL_TABLET | Freq: Three times a day (TID) | ORAL | 0 refills | Status: DC | PRN

## 2019-07-05 NOTE — Progress Notes (Signed)
Subjective: Sherri Arnold is a 49 y.o. female patient who returns to office for follow-up evaluation of left ankle pain.  Patient had a ankle/work-related injury on April 16, 2017 that she is still recovering from. Reports pain is still the same but now all the way up to her knee 8/10 constant in nature sharp, burning, tingling, numbness with no relief, taking Tylenol, Motrin, and Flexeril PRN. Reports that her PMR appt was rescheduled. Reports that now she is having right shoulder issues that may be the result of her fall as well.  No other pedal complaints noted at this time.  Patient Active Problem List   Diagnosis Date Noted   Bilateral carotid artery stenosis 03/24/2017   Left subclavian artery occlusion 03/24/2017   Occlusion of vertebral artery 03/24/2017   Chest pain 07/27/2016   Ventricular premature beats 07/27/2016    Current Outpatient Medications on File Prior to Visit  Medication Sig Dispense Refill   albuterol (PROVENTIL) (2.5 MG/3ML) 0.083% nebulizer solution Take 3 mLs (2.5 mg total) by nebulization every 4 (four) hours as needed for wheezing or shortness of breath. 75 mL 1   amitriptyline (ELAVIL) 25 MG tablet Take 1 tablet (25 mg total) by mouth at bedtime. 30 tablet 1   amitriptyline (ELAVIL) 25 MG tablet      aspirin (GOODSENSE ASPIRIN) 325 MG tablet Take 325 mg by mouth daily.     benzonatate (TESSALON) 200 MG capsule      cetirizine (ZYRTEC) 10 MG tablet TAKE 1 TAB(S) ORALLY ONCE (AT BEDTIME) FOR 30 DAY(S)     cetirizine-pseudoephedrine (ZYRTEC-D) 5-120 MG tablet Take 1 tablet by mouth 2 (two) times daily.      estrogens, conjugated, (PREMARIN) 1.25 MG tablet Take 1.25 mg by mouth daily.     Fluticasone Furoate (ARNUITY ELLIPTA) 100 MCG/ACT AEPB Inhale 1 Dose into the lungs daily. Rinse, gargle, and spit after use. 1 each 3   ibuprofen (ADVIL) 800 MG tablet Take 1 tablet (800 mg total) by mouth every 8 (eight) hours as needed. 30 tablet 0   meloxicam  (MOBIC) 15 MG tablet Take 15 mg by mouth daily.     olopatadine (PATANOL) 0.1 % ophthalmic solution Place 1 drop into both eyes 2 (two) times daily. 5 mL 4   pantoprazole (PROTONIX) 40 MG tablet      predniSONE (STERAPRED UNI-PAK 21 TAB) 10 MG (21) TBPK tablet Take as directed 21 tablet 0   rosuvastatin (CRESTOR) 10 MG tablet Take 10 mg by mouth daily.     triamcinolone cream (KENALOG) 0.1 % TAKE 1 APPLICATION APPLIED TOPICALLY 3 TIMES A DAY FOR 7 DAY(S)     VENTOLIN HFA 108 (90 Base) MCG/ACT inhaler      Vitamin D, Ergocalciferol, (DRISDOL) 1.25 MG (50000 UT) CAPS capsule      Current Facility-Administered Medications on File Prior to Visit  Medication Dose Route Frequency Provider Last Rate Last Admin   ipratropium-albuterol (DUONEB) 0.5-2.5 (3) MG/3ML nebulizer solution 3 mL  3 mL Nebulization Q6H Padgett, Rae Halsted, MD        Allergies  Allergen Reactions   Ciprofloxacin Hcl Swelling   Propofol Other (See Comments)    Objective:  General: Alert and oriented x3 in no acute distress  Dermatology: No open lesions bilateral lower extremities, no webspace macerations, no ecchymosis bilateral, all nails x 10 are well manicured.  Vascular: Dorsalis Pedis and Posterior Tibial pedal pulses faintly palpable, Capillary Fill Time 3 seconds,(+) pedal hair growth bilateral, trace edema  left foot and ankle diffuse.   Neurology: Johney Maine sensation intact via light touch bilateral. Protective and vibratory diminished on the left, Positive Valleuix sign deep peroneal nerve distribution and Tinel's sign at the sural nerve branch of the left foot and ankle.  Musculoskeletal: Mild to moderate tenderness with palpation at lateral ankle>medial ankle with most pain today af distal fibula, peroneal tendon course and lateral ankle ligaments on left like prior. No pain with calf compression bilateral. Motion guarded on left.  Strength within normal limits in all groups bilateral except left with  subjective weakness like before with guarding versus early foot drop on left.  Gait: Antalgic gait in bedroom slippers no cane this visit   Assessment and Plan: Problem List Items Addressed This Visit    None    Visit Diagnoses    Complex regional pain syndrome type 1 of left lower extremity    -  Primary   Complex regional pain syndrome type 2 of left lower extremity       Injury of left ankle, sequela       Sprain of tibiofibular ligament of left ankle, sequela       Chronic pain of left ankle       Left foot pain       PVD (peripheral vascular disease) ([facility])          -Complete examination performed -Duty paperwork completed again this visit for patient with recommendations to continue with no work  -Re-Discussed again with patient treatement options for continued pain after injury at work/ CRPS and mental health -Pain journal reviewed  -Awaiting PMR eval now scheduled for August -Advised patient to continue with flexeril and Biofreeze as tolerated like before; refill of flexeril provided  -Refilled Motrin as well -Continue with comfortable shoes as tolerated like before and cane for stability to prevent against falls -Return to office for follow-up evaluation in 4 to 5 weeks or sooner if problems or issues arise.  Landis Martins, DPM

## 2019-08-06 ENCOUNTER — Ambulatory Visit: Payer: Self-pay | Admitting: Psychology

## 2019-08-08 ENCOUNTER — Other Ambulatory Visit: Payer: Self-pay

## 2019-08-08 ENCOUNTER — Encounter: Payer: Self-pay | Admitting: Sports Medicine

## 2019-08-08 ENCOUNTER — Ambulatory Visit (INDEPENDENT_AMBULATORY_CARE_PROVIDER_SITE_OTHER): Admitting: Sports Medicine

## 2019-08-08 DIAGNOSIS — M79672 Pain in left foot: Secondary | ICD-10-CM

## 2019-08-08 DIAGNOSIS — S93432S Sprain of tibiofibular ligament of left ankle, sequela: Secondary | ICD-10-CM | POA: Diagnosis not present

## 2019-08-08 DIAGNOSIS — S99912S Unspecified injury of left ankle, sequela: Secondary | ICD-10-CM | POA: Diagnosis not present

## 2019-08-08 DIAGNOSIS — G5772 Causalgia of left lower limb: Secondary | ICD-10-CM

## 2019-08-08 DIAGNOSIS — I739 Peripheral vascular disease, unspecified: Secondary | ICD-10-CM

## 2019-08-08 DIAGNOSIS — M25572 Pain in left ankle and joints of left foot: Secondary | ICD-10-CM

## 2019-08-08 DIAGNOSIS — G90522 Complex regional pain syndrome I of left lower limb: Secondary | ICD-10-CM | POA: Diagnosis not present

## 2019-08-08 DIAGNOSIS — G8929 Other chronic pain: Secondary | ICD-10-CM

## 2019-08-08 MED ORDER — MELOXICAM 15 MG PO TABS: 15.0000 mg | ORAL_TABLET | Freq: Every day | ORAL | 2 refills | Status: DC

## 2019-08-08 NOTE — Progress Notes (Signed)
Subjective: Sherri Arnold is a 49 y.o. female patient who returns to office for follow-up evaluation of left ankle pain.  Patient had injury 04/19/2017 at work. Reports that she went to physchiatry and still has PMR on 8/30, reports that pain is stabbing in nature right now with twitches to the toes, reports more flexibility but still weakness and pain. Has not been able to get back on her Mobic due to her previous doctor not getting it to the pharmacy and reports that when she was taking this felt like it helped a little more than Ibuprofen and reports that she take flexeril sometimes when pain or cramping is bad.  No other pedal complaints noted at this time.  Patient Active Problem List   Diagnosis Date Noted   Bilateral carotid artery stenosis 03/24/2017   Left subclavian artery occlusion 03/24/2017   Occlusion of vertebral artery 03/24/2017   Chest pain 07/27/2016   Ventricular premature beats 07/27/2016    Current Outpatient Medications on File Prior to Visit  Medication Sig Dispense Refill   albuterol (PROVENTIL) (2.5 MG/3ML) 0.083% nebulizer solution Take 3 mLs (2.5 mg total) by nebulization every 4 (four) hours as needed for wheezing or shortness of breath. 75 mL 1   amitriptyline (ELAVIL) 25 MG tablet Take 1 tablet (25 mg total) by mouth at bedtime. 30 tablet 1   amitriptyline (ELAVIL) 25 MG tablet      aspirin (GOODSENSE ASPIRIN) 325 MG tablet Take 325 mg by mouth daily.     benzonatate (TESSALON) 200 MG capsule      cetirizine (ZYRTEC) 10 MG tablet TAKE 1 TAB(S) ORALLY ONCE (AT BEDTIME) FOR 30 DAY(S)     cetirizine-pseudoephedrine (ZYRTEC-D) 5-120 MG tablet Take 1 tablet by mouth 2 (two) times daily.      cyclobenzaprine (FLEXERIL) 10 MG tablet Take 1 tablet (10 mg total) by mouth 3 (three) times daily as needed for muscle spasms. 30 tablet 0   estrogens, conjugated, (PREMARIN) 1.25 MG tablet Take 1.25 mg by mouth daily.     Fluticasone Furoate (ARNUITY ELLIPTA)  100 MCG/ACT AEPB Inhale 1 Dose into the lungs daily. Rinse, gargle, and spit after use. 1 each 3   ibuprofen (ADVIL) 800 MG tablet Take 1 tablet (800 mg total) by mouth every 8 (eight) hours as needed. 30 tablet 0   olopatadine (PATANOL) 0.1 % ophthalmic solution Place 1 drop into both eyes 2 (two) times daily. 5 mL 4   pantoprazole (PROTONIX) 40 MG tablet      predniSONE (STERAPRED UNI-PAK 21 TAB) 10 MG (21) TBPK tablet Take as directed 21 tablet 0   rosuvastatin (CRESTOR) 10 MG tablet Take 10 mg by mouth daily.     triamcinolone cream (KENALOG) 0.1 % TAKE 1 APPLICATION APPLIED TOPICALLY 3 TIMES A DAY FOR 7 DAY(S)     VENTOLIN HFA 108 (90 Base) MCG/ACT inhaler      Vitamin D, Ergocalciferol, (DRISDOL) 1.25 MG (50000 UT) CAPS capsule      Current Facility-Administered Medications on File Prior to Visit  Medication Dose Route Frequency Provider Last Rate Last Admin   ipratropium-albuterol (DUONEB) 0.5-2.5 (3) MG/3ML nebulizer solution 3 mL  3 mL Nebulization Q6H Padgett, Rae Halsted, MD        Allergies  Allergen Reactions   Ciprofloxacin Hcl Swelling   Propofol Other (See Comments)    Objective:  General: Alert and oriented x3 in no acute distress  Dermatology: No open lesions bilateral lower extremities, no webspace macerations, no ecchymosis  bilateral, all nails x 10 are well manicured.  Vascular: Dorsalis Pedis and Posterior Tibial pedal pulses faintly palpable, Capillary Fill Time 3 seconds,(+) pedal hair growth bilateral, trace edema left foot and ankle with varicosities and mild purple hue to the left greater than right foot and ankle.   Neurology: Johney Maine sensation intact via light touch bilateral. Protective and vibratory diminished on the left, Positive Valleuix sign deep peroneal nerve distribution and Tinel's sign at the sural nerve branch of the left foot and ankle.  Subjective numbness, tingling, burning, and sharp stabbing pain to all toes on the  left  Musculoskeletal: Mild to moderate tenderness with palpation at lateral ankle>medial ankle with most pain today af distal fibula and midshaft, peroneal tendon course and lateral ankle ligaments on left like prior. No pain with calf compression bilateral. Motion guarded on left.  Strength within normal limits in all groups bilateral except left with subjective weakness like before with guarding versus early foot drop on left.  Range of motion compared to the right and there is guarding/limitation noted in all planes of motion on the left.  Gait: Antalgic gait in bedroom slippers  Assessment and Plan: Problem List Items Addressed This Visit    None    Visit Diagnoses    Complex regional pain syndrome type 1 of left lower extremity    -  Primary   Relevant Medications   meloxicam (MOBIC) 15 MG tablet   Complex regional pain syndrome type 2 of left lower extremity       Relevant Medications   meloxicam (MOBIC) 15 MG tablet   Injury of left ankle, sequela       Sprain of tibiofibular ligament of left ankle, sequela       Chronic pain of left ankle       Relevant Medications   meloxicam (MOBIC) 15 MG tablet   Left foot pain       PVD (peripheral vascular disease) (HCC)          -Complete examination performed -Duty paperwork completed again this visit for patient with recommendations to continue with no work due to chronic pain and limited instability plateauing not improved -Re-Discussed again with patient treatement options for continued pain after injury at work/ [employer] -Patient has been seen by psychiatry -Awaiting PMR eval now scheduled for August 30th -Advised patient to continue with flexeril and Biofreeze as tolerated like before to tolerance -Refilled meloxicam to take in place of Motrin for pain and inflammation -Continue with comfortable shoes as tolerated like before and cane for stability to prevent against falls like previous especially since her left foot and ankle with  weak and painful -Return to office for follow-up evaluation in 4 to 5 weeks or sooner if problems or issues arise.  Landis Martins, DPM

## 2019-08-15 DIAGNOSIS — M79676 Pain in unspecified toe(s): Secondary | ICD-10-CM

## 2019-09-10 ENCOUNTER — Encounter: Payer: Self-pay | Admitting: Psychology

## 2019-09-12 ENCOUNTER — Ambulatory Visit: Payer: Medicaid Other | Admitting: Sports Medicine

## 2019-09-19 ENCOUNTER — Other Ambulatory Visit: Payer: Self-pay

## 2019-09-19 ENCOUNTER — Ambulatory Visit (INDEPENDENT_AMBULATORY_CARE_PROVIDER_SITE_OTHER): Admitting: Sports Medicine

## 2019-09-19 ENCOUNTER — Encounter: Payer: Self-pay | Admitting: Sports Medicine

## 2019-09-19 DIAGNOSIS — G5772 Causalgia of left lower limb: Secondary | ICD-10-CM | POA: Diagnosis not present

## 2019-09-19 DIAGNOSIS — S99912S Unspecified injury of left ankle, sequela: Secondary | ICD-10-CM

## 2019-09-19 DIAGNOSIS — M79672 Pain in left foot: Secondary | ICD-10-CM

## 2019-09-19 DIAGNOSIS — G90522 Complex regional pain syndrome I of left lower limb: Secondary | ICD-10-CM | POA: Diagnosis not present

## 2019-09-19 DIAGNOSIS — S93432S Sprain of tibiofibular ligament of left ankle, sequela: Secondary | ICD-10-CM

## 2019-09-19 DIAGNOSIS — M25572 Pain in left ankle and joints of left foot: Secondary | ICD-10-CM

## 2019-09-19 DIAGNOSIS — G8929 Other chronic pain: Secondary | ICD-10-CM

## 2019-09-19 MED ORDER — IBUPROFEN 800 MG PO TABS
800.0000 mg | ORAL_TABLET | Freq: Three times a day (TID) | ORAL | 0 refills | Status: DC | PRN
Start: 2019-09-19 — End: 2019-09-22

## 2019-09-19 NOTE — Progress Notes (Signed)
Subjective: Sherri Arnold is a 49 y.o. female patient who returns to office for follow-up evaluation of left ankle pain.  Patient had injury 04/19/2017 at work. Reports that she is waiting for follow-up with PMR they had to reschedule her appointment due to work [employer] issues.  Patient reports that pain is still the same increased knee pain to the lateral leg and calf reports that there is constant numbness tingling burning swelling that progresses by the end of the day states that if she has been sitting with her legs down longer than 2 hours the swelling and pain increases as well as the purple discoloration reports that there is pain in her ankle and feels like the lateral side hurts more than the medial as well as her leg locking up from time to time has been using cane to provide some stability reports that she has been alternating between ibuprofen and meloxicam to help give her some relief even though it does not take away all of her symptoms.  Patient also reports that she occasionally takes Flexeril and uses her topical pain cream or rub as needed as well.  No other acute issues.  Patient Active Problem List   Diagnosis Date Noted  . Bilateral carotid artery stenosis 03/24/2017  . Left subclavian artery occlusion 03/24/2017  . Occlusion of vertebral artery 03/24/2017  . Chest pain 07/27/2016  . Ventricular premature beats 07/27/2016    Current Outpatient Medications on File Prior to Visit  Medication Sig Dispense Refill  . albuterol (PROVENTIL) (2.5 MG/3ML) 0.083% nebulizer solution Take 3 mLs (2.5 mg total) by nebulization every 4 (four) hours as needed for wheezing or shortness of breath. 75 mL 1  . amitriptyline (ELAVIL) 25 MG tablet Take 1 tablet (25 mg total) by mouth at bedtime. 30 tablet 1  . amitriptyline (ELAVIL) 25 MG tablet     . aspirin (GOODSENSE ASPIRIN) 325 MG tablet Take 325 mg by mouth daily.    . benzonatate (TESSALON) 200 MG capsule     . cetirizine (ZYRTEC)  10 MG tablet TAKE 1 TAB(S) ORALLY ONCE (AT BEDTIME) FOR 30 DAY(S)    . cetirizine-pseudoephedrine (ZYRTEC-D) 5-120 MG tablet Take 1 tablet by mouth 2 (two) times daily.     . cyclobenzaprine (FLEXERIL) 10 MG tablet Take 1 tablet (10 mg total) by mouth 3 (three) times daily as needed for muscle spasms. 30 tablet 0  . estrogens, conjugated, (PREMARIN) 1.25 MG tablet Take 1.25 mg by mouth daily.    . Fluticasone Furoate (ARNUITY ELLIPTA) 100 MCG/ACT AEPB Inhale 1 Dose into the lungs daily. Rinse, gargle, and spit after use. 1 each 3  . ibuprofen (ADVIL) 800 MG tablet Take 1 tablet (800 mg total) by mouth every 8 (eight) hours as needed. 30 tablet 0  . meloxicam (MOBIC) 15 MG tablet Take 1 tablet (15 mg total) by mouth daily. 30 tablet 2  . olopatadine (PATANOL) 0.1 % ophthalmic solution Place 1 drop into both eyes 2 (two) times daily. 5 mL 4  . pantoprazole (PROTONIX) 40 MG tablet     . predniSONE (STERAPRED UNI-PAK 21 TAB) 10 MG (21) TBPK tablet Take as directed 21 tablet 0  . rosuvastatin (CRESTOR) 10 MG tablet Take 10 mg by mouth daily.    Marland Kitchen triamcinolone cream (KENALOG) 0.1 % TAKE 1 APPLICATION APPLIED TOPICALLY 3 TIMES A DAY FOR 7 DAY(S)    . VENTOLIN HFA 108 (90 Base) MCG/ACT inhaler     . Vitamin D, Ergocalciferol, (DRISDOL)  1.25 MG (50000 UT) CAPS capsule      Current Facility-Administered Medications on File Prior to Visit  Medication Dose Route Frequency Provider Last Rate Last Admin  . ipratropium-albuterol (DUONEB) 0.5-2.5 (3) MG/3ML nebulizer solution 3 mL  3 mL Nebulization Q6H Padgett, Rae Halsted, MD        Allergies  Allergen Reactions  . Ciprofloxacin Hcl Swelling  . Propofol Other (See Comments)    Objective:  General: Alert and oriented x3 in no acute distress  Dermatology: No open lesions bilateral lower extremities, no webspace macerations, no ecchymosis bilateral, all nails x 10 are well manicured.  Vascular: Dorsalis Pedis and Posterior Tibial pedal pulses  faintly palpable, Capillary Fill Time 3 seconds,(+) pedal hair growth bilateral, trace edema left foot and ankle with varicosities and mild purple hue to the left greater than right foot and ankle like previous.   Neurology: Johney Maine sensation intact via light touch bilateral. Protective and vibratory diminished on the left, Positive Valleuix sign deep peroneal nerve distribution and Tinel's sign at the sural nerve branch of the left foot and ankle.  Subjective numbness, tingling, burning, and sharp stabbing pain to all toes on the left like previous  Musculoskeletal: Mild to moderate tenderness with palpation at lateral ankle>medial ankle with most pain today af distal fibula and midshaft, peroneal tendon course and lateral ankle ligaments on left like prior with new pain extending to the lateral calf of the left leg. No pain with calf compression bilateral. Motion guarded on left.  Strength within normal limits in all groups bilateral except left with subjective weakness like before with guarding versus early foot drop on left.  Range of motion compared to the right and there is guarding/limitation noted in all planes of motion on the left.  Gait: Antalgic gait using cane  Assessment and Plan: Problem List Items Addressed This Visit    None    Visit Diagnoses    Complex regional pain syndrome type 1 of left lower extremity    -  Primary   Complex regional pain syndrome type 2 of left lower extremity       Injury of left ankle, sequela       Sprain of tibiofibular ligament of left ankle, sequela       Chronic pain of left ankle       Left foot pain          -Complete examination performed -Duty paperwork completed again this visit for patient with recommendations to continue with no work due to chronic pain and limited instability plateauing not improved same as previous -Re-Discussed again with patient treatement options for continued pain after injury at work/ [employer] -Awaiting PMR eval; reports  that her insurance company will need 5 to 15 days to review -Advised patient to continue with flexeril and Biofreeze as tolerated like before to tolerance -Refilled Motrin for pain and inflammation to take in between doses or in place of meloxicam -Continue with comfortable shoes as tolerated like before and cane for stability to prevent against falls like previous especially since her left foot and ankle with weak and painful like before -Return to office for follow-up evaluation in 4 to 5 weeks or sooner if problems or issues arise.  Landis Martins, DPM

## 2019-09-20 ENCOUNTER — Other Ambulatory Visit: Payer: Self-pay | Admitting: Sports Medicine

## 2019-10-04 IMAGING — MR MR ANKLE*L* W/O CM
5 series · 40 of 40 positions shown · non-contrast
Comparison: None.

CLINICAL DATA: Left lateral and anterior ankle pain since a injury
04/16/2017. Twisted ankle.

EXAM:
MRI OF THE LEFT ANKLE WITHOUT CONTRAST
TECHNIQUE: Multiplanar, multisequence MR imaging of the ankle was performed. No
intravenous contrast was administered.

[Series 4: T2 fat-sat · axial · 3.0mm · 0.50mm/px · z∈[-26,+94]mm · 10 of 32 slices shown (1 of 2)]
[im 1/32]
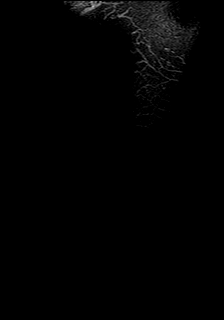
[im 4/32]
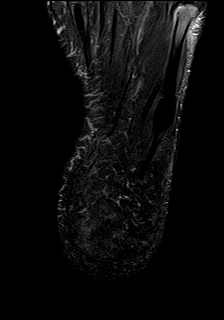
[im 7/32]
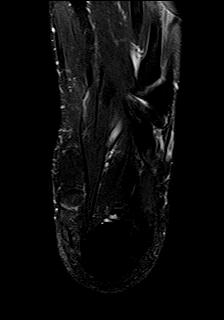
[im 11/32]
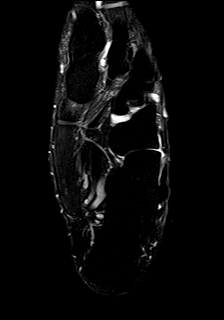
[im 14/32]
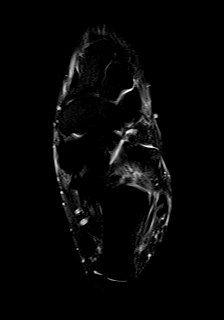
[im 18/32]
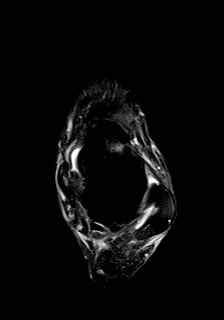
[im 21/32]
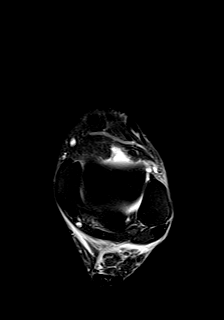
[im 25/32]
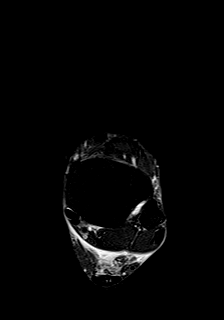
[im 28/32]
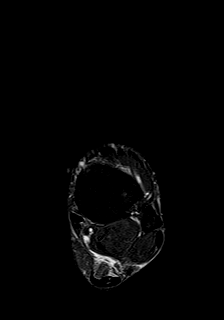
[im 32/32]
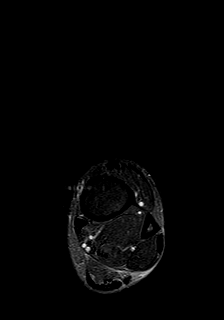

[Series 5: PD fat-sat · axial · 3.0mm · 0.42mm/px · z∈[-26,+94]mm · 10 of 32 slices shown]
[im 1/32]
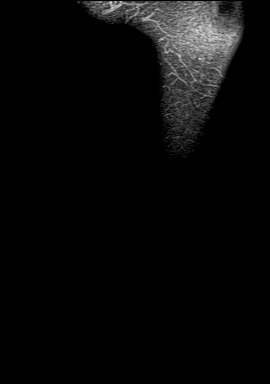
[im 4/32]
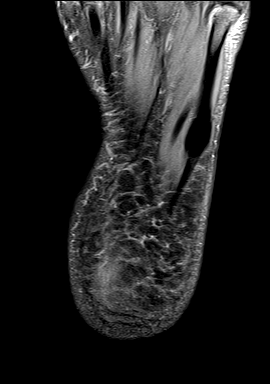
[im 7/32]
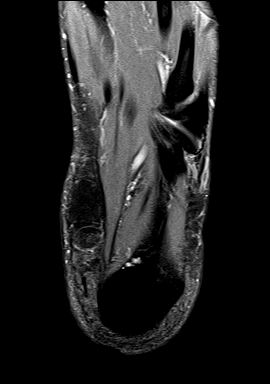
[im 11/32]
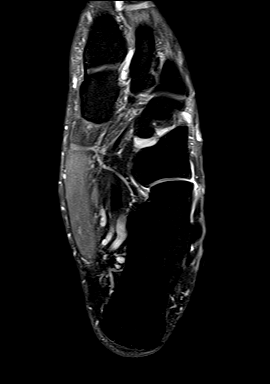
[im 14/32]
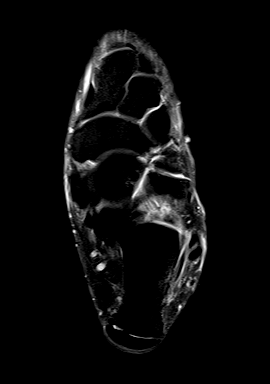
[im 18/32]
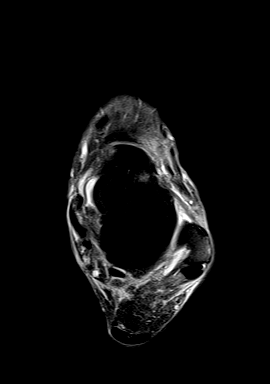
[im 21/32]
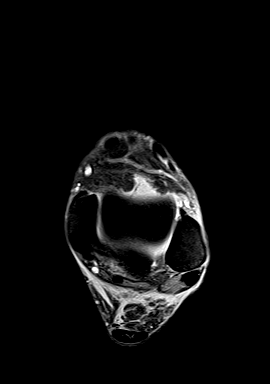
[im 25/32]
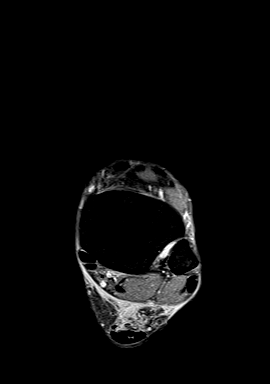
[im 28/32]
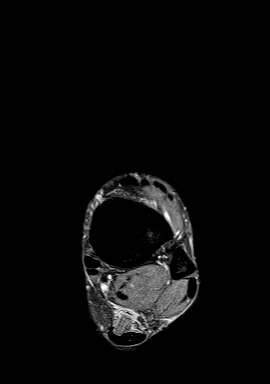
[im 32/32]
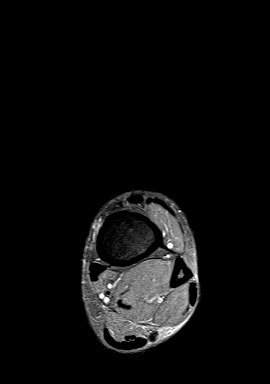

[Series 6: T1 · sagittal · 4.0mm · 0.50mm/px · 5 of 18 slices shown]
[im 1/18]
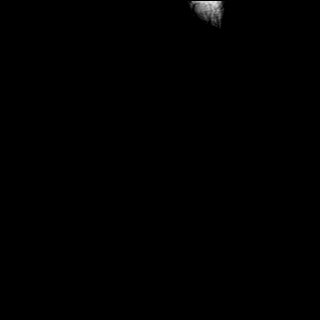
[im 5/18]
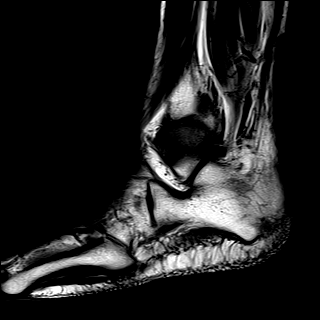
[im 9/18]
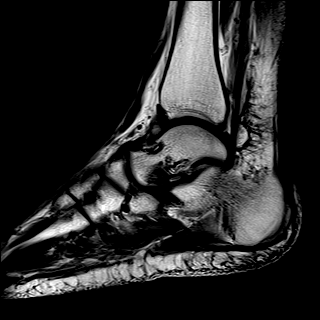
[im 13/18]
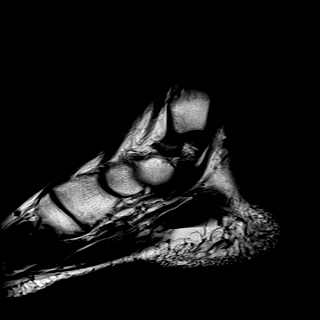
[im 18/18]
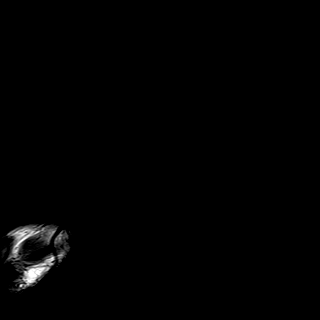

[Series 7: STIR · sagittal · 4.0mm · 0.31mm/px · 5 of 18 slices shown]
[im 1/18]
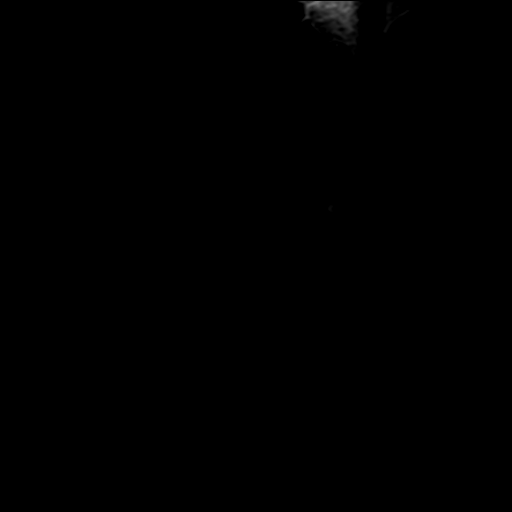
[im 5/18]
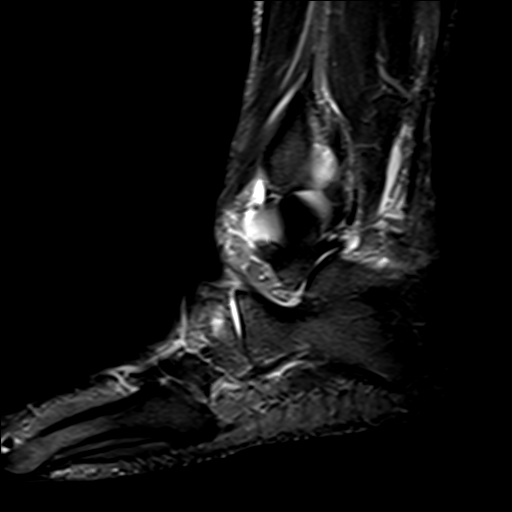
[im 9/18]
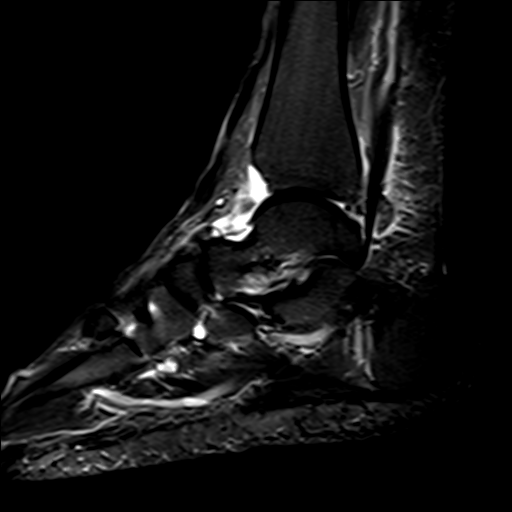
[im 13/18]
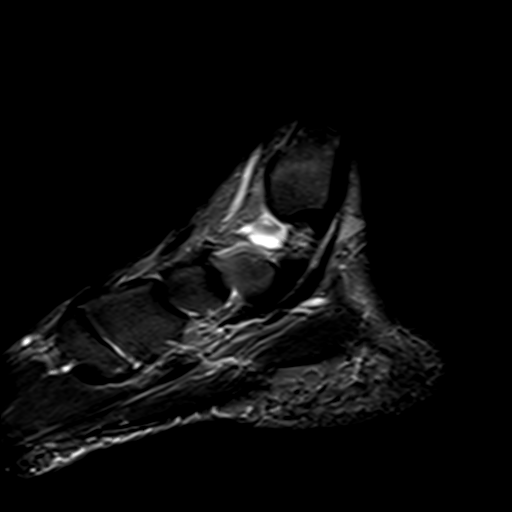
[im 18/18]
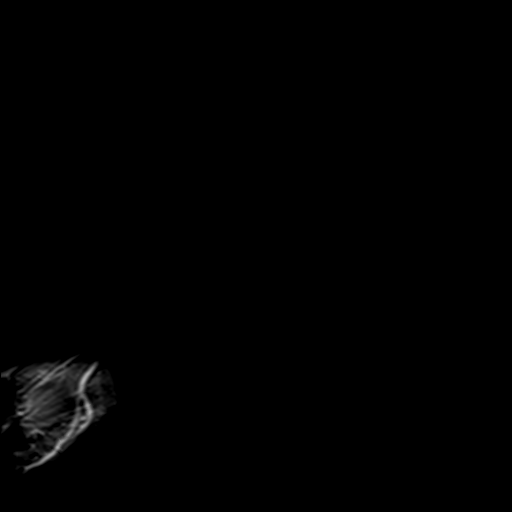

[Series 9: T2 fat-sat · coronal · 3.0mm · 0.50mm/px · 10 of 34 slices shown (2 of 2)]
[im 1/34]
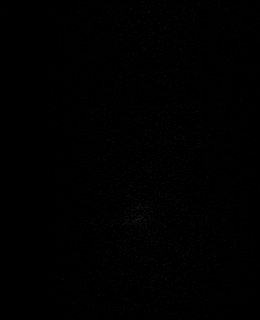
[im 4/34]
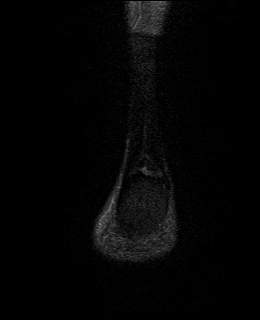
[im 8/34]
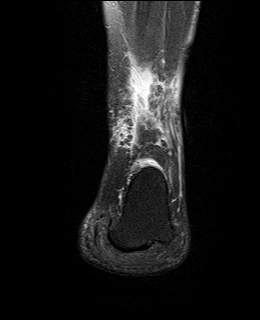
[im 12/34]
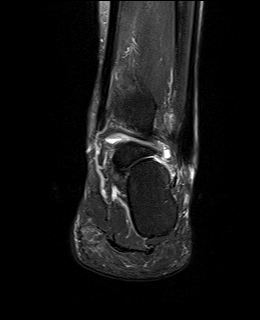
[im 15/34]
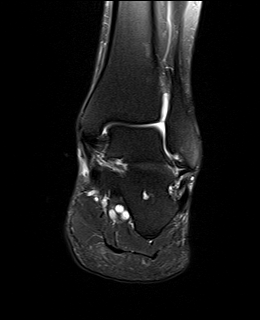
[im 19/34]
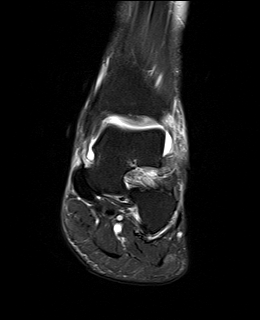
[im 23/34]
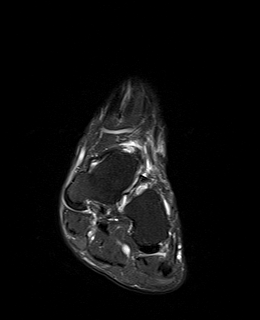
[im 26/34]
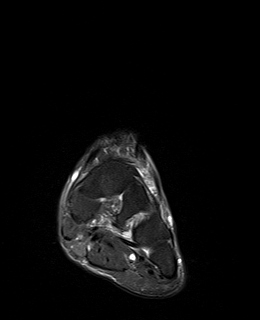
[im 30/34]
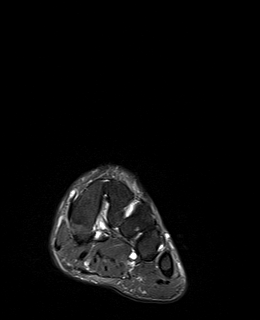
[im 34/34]
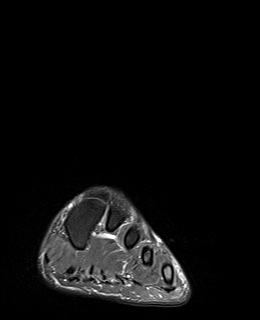

[40 of 40 positions shown; findings below may reference images not displayed]

FINDINGS: TENDONS

Peroneal: Intact

Posteromedial: Intact

Anterior: Intact. Mild tenosynovitis involving the extensor
digitorum longus.

Achilles: Intact. Mild tendinopathy with intrasubstance signal
changes.

Plantar Fascia: Intact.

LIGAMENTS

Lateral: The anterior tibiofibular ligament is torn. The posterior
tibiofibular ligament is intact and the anterior and posterior
talofibular ligaments are intact. There is associated fluid in the
tibiofibular syndesmosis.

Medial: Intact

CARTILAGE

Ankle Joint: Small to moderate ankle joint effusion along with fluid
in the tibiofibular syndesmosis. No cartilage defects or
osteochondral abnormality.

Subtalar Joints/Sinus Tarsi: The subtalar joints are maintained.
There is mild fluid and edema in the sinus tarsi. The cervical and
interosseous ligaments are intact and the spring ligament is intact.

Bones: Mild marrow edema in the distal fibula without discrete
fracture.

Other: Normal appearance of the foot musculature.
IMPRESSION: 1. Findings consistent with a high ankle sprain with tearing of the
anterior tibiofibular ligament. The posterior tibiofibular ligament
is intact and the anterior posterior talofibular ligaments are
intact.
2. Intact medial and lateral ankle tendons and deltoid ligament
complex.
3. Mild marrow edema in the distal fibula but no fracture.
4. Tibiotalar joint effusion and fluid in the tibiofibular joint.
There is also mild fluid/edema in the sinus tarsi.

## 2019-10-19 ENCOUNTER — Other Ambulatory Visit: Payer: Self-pay

## 2019-10-19 ENCOUNTER — Encounter: Payer: Self-pay | Admitting: Sports Medicine

## 2019-10-19 ENCOUNTER — Ambulatory Visit (INDEPENDENT_AMBULATORY_CARE_PROVIDER_SITE_OTHER): Admitting: Sports Medicine

## 2019-10-19 DIAGNOSIS — S99912S Unspecified injury of left ankle, sequela: Secondary | ICD-10-CM

## 2019-10-19 DIAGNOSIS — M79672 Pain in left foot: Secondary | ICD-10-CM

## 2019-10-19 DIAGNOSIS — G5772 Causalgia of left lower limb: Secondary | ICD-10-CM

## 2019-10-19 DIAGNOSIS — S93432S Sprain of tibiofibular ligament of left ankle, sequela: Secondary | ICD-10-CM

## 2019-10-19 DIAGNOSIS — I739 Peripheral vascular disease, unspecified: Secondary | ICD-10-CM

## 2019-10-19 DIAGNOSIS — G8929 Other chronic pain: Secondary | ICD-10-CM

## 2019-10-19 DIAGNOSIS — G90522 Complex regional pain syndrome I of left lower limb: Secondary | ICD-10-CM

## 2019-10-19 DIAGNOSIS — M25572 Pain in left ankle and joints of left foot: Secondary | ICD-10-CM

## 2019-10-19 NOTE — Progress Notes (Signed)
Subjective: Sherri Arnold is a 49 y.o. female patient who returns to office for follow-up evaluation of left ankle pain.  Patient had injury 04/19/2017 at work. Reports that she still has pain now radiating up the hip and low back pain left shoulder.  Patient reports that she is not able to sleep well at night due to pain.  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  No other acute issues.  Patient Active Problem List   Diagnosis Date Noted  . Bilateral carotid artery stenosis 03/24/2017  . Left subclavian artery occlusion 03/24/2017  . Occlusion of vertebral artery 03/24/2017  . Chest pain 07/27/2016  . Ventricular premature beats 07/27/2016    Current Outpatient Medications on File Prior to Visit  Medication Sig Dispense Refill  . albuterol (PROVENTIL) (2.5 MG/3ML) 0.083% nebulizer solution Take 3 mLs (2.5 mg total) by nebulization every 4 (four) hours as needed for wheezing or shortness of breath. 75 mL 1  . amitriptyline (ELAVIL) 25 MG tablet Take 1 tablet (25 mg total) by mouth at bedtime. 30 tablet 1  . amitriptyline (ELAVIL) 25 MG tablet     . aspirin (GOODSENSE ASPIRIN) 325 MG tablet Take 325 mg by mouth daily.    . cetirizine-pseudoephedrine (ZYRTEC-D) 5-120 MG tablet Take 1 tablet by mouth 2 (two) times daily.     . cyclobenzaprine (FLEXERIL) 10 MG tablet Take 1 tablet (10 mg total) by mouth 3 (three) times daily as needed for muscle spasms. 30 tablet 0  . estrogens, conjugated, (PREMARIN) 1.25 MG tablet Take 1.25 mg by mouth daily.    . Fluticasone Furoate (ARNUITY ELLIPTA) 100 MCG/ACT AEPB Inhale 1 Dose into the lungs daily. Rinse, gargle, and spit after use. 1 each 3  . ibuprofen (ADVIL) 800 MG tablet TAKE 1 TABLET BY MOUTH EVERY 8 HOURS AS NEEDED 30 tablet 0  . meloxicam (MOBIC) 15 MG tablet Take 1 tablet (15 mg total) by mouth daily. 30 tablet 2  . olopatadine (PATANOL) 0.1 %  ophthalmic solution Place 1 drop into both eyes 2 (two) times daily. 5 mL 4  . pantoprazole (PROTONIX) 40 MG tablet     . predniSONE (STERAPRED UNI-PAK 21 TAB) 10 MG (21) TBPK tablet Take as directed 21 tablet 0  . rosuvastatin (CRESTOR) 10 MG tablet Take 10 mg by mouth daily.    Marland Kitchen triamcinolone cream (KENALOG) 0.1 % TAKE 1 APPLICATION APPLIED TOPICALLY 3 TIMES A DAY FOR 7 DAY(S)    . VENTOLIN HFA 108 (90 Base) MCG/ACT inhaler     . Vitamin D, Ergocalciferol, (DRISDOL) 1.25 MG (50000 UT) CAPS capsule      Current Facility-Administered Medications on File Prior to Visit  Medication Dose Route Frequency Provider Last Rate Last Admin  . ipratropium-albuterol (DUONEB) 0.5-2.5 (3) MG/3ML nebulizer solution 3 mL  3 mL Nebulization Q6H Padgett, Rae Halsted, MD        Allergies  Allergen Reactions  . Ciprofloxacin Hcl Swelling  . Propofol Other (See Comments)    Objective:  General: Alert and oriented x3 in no acute distress  Dermatology: No open lesions bilateral lower extremities, no webspace macerations, no ecchymosis bilateral, all nails x 10 are well manicured.  Vascular: Dorsalis Pedis and Posterior Tibial pedal pulses faintly palpable, Capillary Fill Time 3 seconds,(+) pedal hair growth bilateral, trace edema left foot and ankle with varicosities and mild purple hue to the left  greater than right foot and ankle like previous.   Neurology: Johney Maine sensation intact via light touch bilateral. Protective and vibratory diminished on the left, Positive Valleuix sign deep peroneal nerve distribution and Tinel's sign at the sural nerve branch of the left foot and ankle.  Subjective numbness, tingling, burning, and sharp stabbing pain to all toes on the left like previous  Musculoskeletal: Mild to moderate tenderness with palpation at lateral ankle>medial ankle with most pain today af distal fibula and midshaft, peroneal tendon course and lateral ankle ligaments on left like prior with new pain  extending to the lateral calf of the left leg. No pain with calf compression bilateral. Motion guarded on left.  Strength within normal limits in all groups bilateral except left with subjective weakness like before with guarding versus early foot drop on left.  Range of motion compared to the right and there is guarding/limitation noted in all planes of motion on the left.  Gait: Antalgic gait using cane which is in the car this visit  Assessment and Plan: Problem List Items Addressed This Visit    None    Visit Diagnoses    Complex regional pain syndrome type 1 of left lower extremity    -  Primary   Complex regional pain syndrome type 2 of left lower extremity       Injury of left ankle, sequela       Sprain of tibiofibular ligament of left ankle, sequela       Chronic pain of left ankle       Left foot pain       PVD (peripheral vascular disease) (HCC)          -Complete examination performed -Duty paperwork completed again this visit  -Re-Discussed again with patient treatement options for continued pain after injury at work/ [employer] -Awaiting PMR eval in [place] patient to continue with Biofreeze as tolerated like before to tolerance and may also try over-the-counter herbal topicals -Continue with Motrin for pain and inflammation to take in between doses or in place of meloxicam -Continue with comfortable shoes as tolerated like before and cane for stability to prevent against falls like before -May also try Flexeril at bedtime to help with rest and advised patient to take 1 to 2 hours before -Return to office for follow-up evaluation in 4 to 5 weeks or sooner if problems or issues arise.  Landis Martins, DPM

## 2019-11-07 DIAGNOSIS — M79676 Pain in unspecified toe(s): Secondary | ICD-10-CM

## 2019-11-16 ENCOUNTER — Ambulatory Visit (INDEPENDENT_AMBULATORY_CARE_PROVIDER_SITE_OTHER): Admitting: Sports Medicine

## 2019-11-16 ENCOUNTER — Encounter: Payer: Self-pay | Admitting: Sports Medicine

## 2019-11-16 ENCOUNTER — Other Ambulatory Visit: Payer: Self-pay

## 2019-11-16 DIAGNOSIS — S99912S Unspecified injury of left ankle, sequela: Secondary | ICD-10-CM

## 2019-11-16 DIAGNOSIS — S93432S Sprain of tibiofibular ligament of left ankle, sequela: Secondary | ICD-10-CM | POA: Diagnosis not present

## 2019-11-16 DIAGNOSIS — R2681 Unsteadiness on feet: Secondary | ICD-10-CM

## 2019-11-16 DIAGNOSIS — G90522 Complex regional pain syndrome I of left lower limb: Secondary | ICD-10-CM

## 2019-11-16 DIAGNOSIS — G8929 Other chronic pain: Secondary | ICD-10-CM

## 2019-11-16 DIAGNOSIS — I739 Peripheral vascular disease, unspecified: Secondary | ICD-10-CM

## 2019-11-16 DIAGNOSIS — M79672 Pain in left foot: Secondary | ICD-10-CM

## 2019-11-16 DIAGNOSIS — M25572 Pain in left ankle and joints of left foot: Secondary | ICD-10-CM

## 2019-11-16 DIAGNOSIS — G5772 Causalgia of left lower limb: Secondary | ICD-10-CM

## 2019-11-16 MED ORDER — IBUPROFEN 800 MG PO TABS
800.0000 mg | ORAL_TABLET | Freq: Three times a day (TID) | ORAL | 0 refills | Status: DC | PRN
Start: 2019-11-16 — End: 2019-12-30

## 2019-11-16 NOTE — Progress Notes (Signed)
Subjective: Sherri Arnold is a 49 y.o. female patient who returns to office for follow-up evaluation of left ankle pain.  Patient had injury 04/19/2017 at work and still suffers with pain.  Patient reports that the pain is horrible feels like the cold weather is making the pain worse with stabbing and aching along her foot and ankle reports that her swelling is doing okay and notices that she swells more in the summertime.  Patient reports that she has pain and stiffness of the foot and ankle joint and reports sharp shooting pain up the leg especially on the lateral side.  Reports that she left her cane in the car but has had episodes of her foot giving out or is getting tired and spasming while walking.  Patient reports that she cannot even sit long because the longer she holds her foot down he gets more numb and it hurts so she constantly has to shift position.  Patient reports that she cannot wear normal shoes can only wear shoes that are loose that slide in because anything too tight hurts. No other acute issues.  Patient Active Problem List   Diagnosis Date Noted  . Left lower quadrant pain 02/07/2019  . Neck mass 12/18/2018  . Referred ear pain, right 12/18/2018  . Cigarette smoker 08/15/2018  . Hoarse 08/15/2018  . Sialadenitis 08/15/2018  . Submandibular gland hypertrophy 08/15/2018  . Vocal cord polyp 08/15/2018  . Bilateral carotid artery stenosis 03/24/2017  . Left subclavian artery occlusion 03/24/2017  . Occlusion of vertebral artery 03/24/2017  . Chest pain 07/27/2016  . Ventricular premature beats 07/27/2016    Current Outpatient Medications on File Prior to Visit  Medication Sig Dispense Refill  . albuterol (PROVENTIL) (2.5 MG/3ML) 0.083% nebulizer solution Take 3 mLs (2.5 mg total) by nebulization every 4 (four) hours as needed for wheezing or shortness of breath. 75 mL 1  . amitriptyline (ELAVIL) 25 MG tablet Take 1 tablet (25 mg total) by mouth at bedtime. 30 tablet 1  .  amitriptyline (ELAVIL) 25 MG tablet     . aspirin (GOODSENSE ASPIRIN) 325 MG tablet Take 325 mg by mouth daily.    . cetirizine-pseudoephedrine (ZYRTEC-D) 5-120 MG tablet Take 1 tablet by mouth 2 (two) times daily.     . cyclobenzaprine (FLEXERIL) 10 MG tablet Take 1 tablet (10 mg total) by mouth 3 (three) times daily as needed for muscle spasms. 30 tablet 0  . estrogens, conjugated, (PREMARIN) 1.25 MG tablet Take 1.25 mg by mouth daily.    . Fluticasone Furoate (ARNUITY ELLIPTA) 100 MCG/ACT AEPB Inhale 1 Dose into the lungs daily. Rinse, gargle, and spit after use. 1 each 3  . meloxicam (MOBIC) 15 MG tablet Take 1 tablet (15 mg total) by mouth daily. 30 tablet 2  . olopatadine (PATANOL) 0.1 % ophthalmic solution Place 1 drop into both eyes 2 (two) times daily. 5 mL 4  . pantoprazole (PROTONIX) 40 MG tablet     . predniSONE (STERAPRED UNI-PAK 21 TAB) 10 MG (21) TBPK tablet Take as directed 21 tablet 0  . rosuvastatin (CRESTOR) 10 MG tablet Take 10 mg by mouth daily.    Marland Kitchen triamcinolone cream (KENALOG) 0.1 % TAKE 1 APPLICATION APPLIED TOPICALLY 3 TIMES A DAY FOR 7 DAY(S)    . VENTOLIN HFA 108 (90 Base) MCG/ACT inhaler     . Vitamin D, Ergocalciferol, (DRISDOL) 1.25 MG (50000 UT) CAPS capsule      Current Facility-Administered Medications on File Prior to Visit  Medication Dose Route Frequency Provider Last Rate Last Admin  . ipratropium-albuterol (DUONEB) 0.5-2.5 (3) MG/3ML nebulizer solution 3 mL  3 mL Nebulization Q6H Padgett, Rae Halsted, MD        Allergies  Allergen Reactions  . Ciprofloxacin Hcl Swelling  . Propofol Other (See Comments)    Objective:  General: Alert and oriented x3 in no acute distress  Dermatology: No open lesions bilateral lower extremities, no webspace macerations, no ecchymosis bilateral, all nails x 10 are well manicured.  Vascular: Dorsalis Pedis and Posterior Tibial pedal pulses faintly palpable, Capillary Fill Time 3 seconds,(+) pedal hair growth  bilateral, trace edema left foot and ankle with varicosities and mild purple hue to the left greater than right foot and ankle like previous.   Neurology: Johney Maine sensation intact via light touch bilateral. Protective and vibratory diminished on the left, Positive Valleuix sign deep peroneal nerve distribution and Tinel's sign at the sural nerve branch of the left foot and ankle with shooting pain to the upper third of the left lateral leg.  Subjective numbness, tingling, burning, and sharp stabbing pain to all toes on the left like previous below the knee  Musculoskeletal: Mild to moderate tenderness with palpation at lateral ankle>medial ankle with most pain today af distal fibula and midshaft of the fibula to the upper third and along the peroneal tendon course and lateral ankle ligaments on left like prior. No pain with calf compression bilateral. Motion guarded on left due to pain.  Strength within normal limits in all groups bilateral except left with guarding due to pain versus early foot drop on left.  Range of motion compared to the right and there is guarding/limitation noted in all planes of motion on the left.  Gait: Antalgic gait using cane which is in the car this visit  Assessment and Plan: Problem List Items Addressed This Visit    None    Visit Diagnoses    Complex regional pain syndrome type 1 of left lower extremity    -  Primary   Relevant Medications   ibuprofen (ADVIL) 800 MG tablet   Complex regional pain syndrome type 2 of left lower extremity       Relevant Medications   ibuprofen (ADVIL) 800 MG tablet   Injury of left ankle, sequela       Sprain of tibiofibular ligament of left ankle, sequela       Chronic pain of left ankle       Relevant Medications   ibuprofen (ADVIL) 800 MG tablet   Left foot pain       PVD (peripheral vascular disease) ([facility])       Gait instability          -Complete examination performed -Duty paperwork completed again this visit   -Re-Discussed again with patient treatement options for continued pain after injury at work/ CRPS that has not improved -Awaiting PMR eval in [place]; waiting on authorization -Advised patient to continue with Biofreeze as tolerated like before to tolerance and may also try over-the-counter herbal topicals as tolerated -Continue with good supportive shoes and added a heel lift this visit to see if this will help with her gait instability as well as the weakness that she has on the left foot -Advised patient to continue with use of cane and prescription provided to allow patient to get an alternate type of cane to help with adding additional stability to prevent against falls due to weakness of the left foot and chronic ongoing  pain -Continue with Flexeril for muscle spasms and pain not relieved by oral anti-inflammatories of Motrin -Return to office for follow-up evaluation in 4 to 5 weeks or sooner if problems or issues arise.  Landis Martins, DPM

## 2019-12-25 ENCOUNTER — Other Ambulatory Visit: Payer: Self-pay | Admitting: Sports Medicine

## 2019-12-25 ENCOUNTER — Ambulatory Visit (INDEPENDENT_AMBULATORY_CARE_PROVIDER_SITE_OTHER): Admitting: Sports Medicine

## 2019-12-25 ENCOUNTER — Other Ambulatory Visit: Payer: Self-pay

## 2019-12-25 ENCOUNTER — Encounter: Payer: Self-pay | Admitting: Sports Medicine

## 2019-12-25 DIAGNOSIS — S93432S Sprain of tibiofibular ligament of left ankle, sequela: Secondary | ICD-10-CM

## 2019-12-25 DIAGNOSIS — S99912S Unspecified injury of left ankle, sequela: Secondary | ICD-10-CM | POA: Diagnosis not present

## 2019-12-25 DIAGNOSIS — M25572 Pain in left ankle and joints of left foot: Secondary | ICD-10-CM

## 2019-12-25 DIAGNOSIS — G90522 Complex regional pain syndrome I of left lower limb: Secondary | ICD-10-CM

## 2019-12-25 DIAGNOSIS — G5772 Causalgia of left lower limb: Secondary | ICD-10-CM

## 2019-12-25 DIAGNOSIS — G8929 Other chronic pain: Secondary | ICD-10-CM

## 2019-12-25 MED ORDER — IBUPROFEN-FAMOTIDINE 800-26.6 MG PO TABS
1.0000 | ORAL_TABLET | Freq: Three times a day (TID) | ORAL | 1 refills | Status: DC | PRN
Start: 2019-12-25 — End: 2020-04-15

## 2019-12-25 MED ORDER — IBUPROFEN-FAMOTIDINE 800-26.6 MG PO TABS: 1.0000 | ORAL_TABLET | Freq: Three times a day (TID) | ORAL | 1 refills | Status: DC | PRN

## 2019-12-25 NOTE — Progress Notes (Signed)
Subjective: Sherri Arnold is a 49 y.o. female patient who returns to office for follow-up evaluation of left ankle pain.  Patient had injury 04/19/2017 at work and still suffers with pain reports that pain is about the same feel pain all the way up to the level of her mid shin on the left.  Reports that with colder weather the pain seems worse.  Constant burning throbbing swelling not as bad in the wintertime as compared to the summer numbness tingling pain to the left foot and ankle.  Patient does admit that she has a little bit of mobility in her toes but still has cramping and spasming takes half a Flexeril pill to help and takes Motrin as well.  Patient still reports that extensive standing and walking causes pain cannot stand or walk more than 10 to 15 minutes at a time due to pain.  Patient reports that she cannot sit more than 10 minutes due to pain as well.  No other acute issues.  Patient Active Problem List   Diagnosis Date Noted  . Left lower quadrant pain 02/07/2019  . Neck mass 12/18/2018  . Referred ear pain, right 12/18/2018  . Cigarette smoker 08/15/2018  . Hoarse 08/15/2018  . Sialadenitis 08/15/2018  . Submandibular gland hypertrophy 08/15/2018  . Vocal cord polyp 08/15/2018  . Bilateral carotid artery stenosis 03/24/2017  . Left subclavian artery occlusion 03/24/2017  . Occlusion of vertebral artery 03/24/2017  . Chest pain 07/27/2016  . Ventricular premature beats 07/27/2016    Current Outpatient Medications on File Prior to Visit  Medication Sig Dispense Refill  . albuterol (PROVENTIL) (2.5 MG/3ML) 0.083% nebulizer solution Take 3 mLs (2.5 mg total) by nebulization every 4 (four) hours as needed for wheezing or shortness of breath. 75 mL 1  . amitriptyline (ELAVIL) 25 MG tablet Take 1 tablet (25 mg total) by mouth at bedtime. 30 tablet 1  . amitriptyline (ELAVIL) 25 MG tablet     . aspirin (GOODSENSE ASPIRIN) 325 MG tablet Take 325 mg by mouth daily.    .  cetirizine-pseudoephedrine (ZYRTEC-D) 5-120 MG tablet Take 1 tablet by mouth 2 (two) times daily.     . cyclobenzaprine (FLEXERIL) 10 MG tablet Take 1 tablet (10 mg total) by mouth 3 (three) times daily as needed for muscle spasms. 30 tablet 0  . estrogens, conjugated, (PREMARIN) 1.25 MG tablet Take 1.25 mg by mouth daily.    . Fluticasone Furoate (ARNUITY ELLIPTA) 100 MCG/ACT AEPB Inhale 1 Dose into the lungs daily. Rinse, gargle, and spit after use. 1 each 3  . ibuprofen (ADVIL) 800 MG tablet Take 1 tablet (800 mg total) by mouth every 8 (eight) hours as needed. 30 tablet 0  . meloxicam (MOBIC) 15 MG tablet Take 1 tablet (15 mg total) by mouth daily. 30 tablet 2  . olopatadine (PATANOL) 0.1 % ophthalmic solution Place 1 drop into both eyes 2 (two) times daily. 5 mL 4  . pantoprazole (PROTONIX) 40 MG tablet     . predniSONE (STERAPRED UNI-PAK 21 TAB) 10 MG (21) TBPK tablet Take as directed 21 tablet 0  . rosuvastatin (CRESTOR) 10 MG tablet Take 10 mg by mouth daily.    Marland Kitchen triamcinolone cream (KENALOG) 0.1 % TAKE 1 APPLICATION APPLIED TOPICALLY 3 TIMES A DAY FOR 7 DAY(S)    . VENTOLIN HFA 108 (90 Base) MCG/ACT inhaler     . Vitamin D, Ergocalciferol, (DRISDOL) 1.25 MG (50000 UT) CAPS capsule      Current Facility-Administered Medications  on File Prior to Visit  Medication Dose Route Frequency Provider Last Rate Last Admin  . ipratropium-albuterol (DUONEB) 0.5-2.5 (3) MG/3ML nebulizer solution 3 mL  3 mL Nebulization Q6H Padgett, Rae Halsted, MD        Allergies  Allergen Reactions  . Ciprofloxacin Hcl Swelling  . Propofol Other (See Comments)    Objective:  General: Alert and oriented x3 in no acute distress  Dermatology: No open lesions bilateral lower extremities, no webspace macerations, no ecchymosis bilateral, all nails x 10 are well manicured.  Vascular: Dorsalis Pedis and Posterior Tibial pedal pulses faintly palpable, Capillary Fill Time 3 seconds,(+) pedal hair growth  bilateral, trace edema left foot and ankle with varicosities and mild purple hue to the left greater than right foot and ankle like previous that is unchanged from prior.   Neurology: Johney Maine sensation intact via light touch bilateral. Protective and vibratory diminished on the left, Positive Valleuix sign deep peroneal nerve distribution and Tinel's sign at the sural nerve branch of the left foot and ankle with shooting pain to the upper third of the left lateral leg.  Subjective numbness, tingling, burning, and sharp stabbing pain to all toes on the left like previous below the knee involving the lower one third of the leg.  Musculoskeletal: Mild tenderness with palpation at lateral ankle>medial ankle with most pain today af distal fibula and midshaft of the fibula to the lower third and along the peroneal tendon course and lateral ankle ligaments on left like prior. No pain with calf compression bilateral. Motion guarded on left due to pain.  Strength within normal limits in all groups bilateral except left with guarding due to pain.  Gait: Antalgic gait using cane which she has with her this visit.  Assessment and Plan: Problem List Items Addressed This Visit   None   Visit Diagnoses    Complex regional pain syndrome type 1 of left lower extremity    -  Primary   Relevant Medications   Ibuprofen-Famotidine 800-26.6 MG TABS   Complex regional pain syndrome type 2 of left lower extremity       Relevant Medications   Ibuprofen-Famotidine 800-26.6 MG TABS   Injury of left ankle, sequela       Sprain of tibiofibular ligament of left ankle, sequela       Chronic pain of left ankle       Relevant Medications   Ibuprofen-Famotidine 800-26.6 MG TABS      -Complete examination performed -Department of labor and duty paperwork completed again this visit  -Re-Discussed again with patient treatement options for continued pain after injury at work/ CRPS unchanged from prior -Awaiting physical  medicine and rehab eval in [place]; waiting on authorization, patient has not heard any updates in regards to this appointment -Advised patient to continue with topicals like previous -Continue with good supportive shoes daily -Advised patient to continue with use of cane for stability and prevent against falls -Continue with Flexeril for muscle spasms and Duexis for pain and inflammation -Return to office for follow-up evaluation in 4 to 5 weeks or sooner if problems or issues arise.  Landis Martins, DPM

## 2019-12-26 ENCOUNTER — Other Ambulatory Visit: Payer: Self-pay | Admitting: Sports Medicine

## 2019-12-26 MED ORDER — IBUPROFEN 800 MG PO TABS: 800.0000 mg | ORAL_TABLET | Freq: Three times a day (TID) | ORAL | 0 refills | Status: DC | PRN

## 2019-12-26 NOTE — Progress Notes (Signed)
Changed Duexis to Motrin 800 since not covered by insurance -Dr. Chauncey Cruel

## 2019-12-30 NOTE — Telephone Encounter (Signed)
Please advise 

## 2020-01-23 ENCOUNTER — Ambulatory Visit: Payer: Medicaid Other | Admitting: Sports Medicine

## 2020-02-13 ENCOUNTER — Other Ambulatory Visit: Payer: Self-pay

## 2020-02-13 ENCOUNTER — Ambulatory Visit (INDEPENDENT_AMBULATORY_CARE_PROVIDER_SITE_OTHER): Admitting: Sports Medicine

## 2020-02-13 ENCOUNTER — Encounter: Payer: Self-pay | Admitting: Sports Medicine

## 2020-02-13 ENCOUNTER — Ambulatory Visit: Payer: Medicaid Other | Admitting: Sports Medicine

## 2020-02-13 DIAGNOSIS — G5772 Causalgia of left lower limb: Secondary | ICD-10-CM | POA: Diagnosis not present

## 2020-02-13 DIAGNOSIS — M79672 Pain in left foot: Secondary | ICD-10-CM

## 2020-02-13 DIAGNOSIS — M25572 Pain in left ankle and joints of left foot: Secondary | ICD-10-CM

## 2020-02-13 DIAGNOSIS — S99912S Unspecified injury of left ankle, sequela: Secondary | ICD-10-CM

## 2020-02-13 DIAGNOSIS — I739 Peripheral vascular disease, unspecified: Secondary | ICD-10-CM

## 2020-02-13 DIAGNOSIS — G90522 Complex regional pain syndrome I of left lower limb: Secondary | ICD-10-CM

## 2020-02-13 DIAGNOSIS — G8929 Other chronic pain: Secondary | ICD-10-CM

## 2020-02-13 DIAGNOSIS — R531 Weakness: Secondary | ICD-10-CM

## 2020-02-13 NOTE — Progress Notes (Signed)
Subjective: Sherri Arnold is a 50 y.o. female patient who returns to office for follow-up evaluation of left ankle pain.  Patient had injury 04/19/2017 work-related and still has constant pain..  Patient reports last night had sharp shooting pain on the lateral side of the foot and lower leg.  Denies any changes with medical history since last encounter.  Patient reports that she did get approved for disability with the help from her lawyer.  No other acute issues.  Patient Active Problem List   Diagnosis Date Noted  . Left lower quadrant pain 02/07/2019  . Neck mass 12/18/2018  . Referred ear pain, right 12/18/2018  . Cigarette smoker 08/15/2018  . Hoarse 08/15/2018  . Sialadenitis 08/15/2018  . Submandibular gland hypertrophy 08/15/2018  . Vocal cord polyp 08/15/2018  . Bilateral carotid artery stenosis 03/24/2017  . Left subclavian artery occlusion 03/24/2017  . Occlusion of vertebral artery 03/24/2017  . Chest pain 07/27/2016  . Ventricular premature beats 07/27/2016    Current Outpatient Medications on File Prior to Visit  Medication Sig Dispense Refill  . albuterol (PROVENTIL) (2.5 MG/3ML) 0.083% nebulizer solution Take 3 mLs (2.5 mg total) by nebulization every 4 (four) hours as needed for wheezing or shortness of breath. 75 mL 1  . amitriptyline (ELAVIL) 25 MG tablet Take 1 tablet (25 mg total) by mouth at bedtime. 30 tablet 1  . amitriptyline (ELAVIL) 25 MG tablet     . aspirin (GOODSENSE ASPIRIN) 325 MG tablet Take 325 mg by mouth daily.    . cetirizine-pseudoephedrine (ZYRTEC-D) 5-120 MG tablet Take 1 tablet by mouth 2 (two) times daily.     . cyclobenzaprine (FLEXERIL) 10 MG tablet Take 1 tablet (10 mg total) by mouth 3 (three) times daily as needed for muscle spasms. 30 tablet 0  . estrogens, conjugated, (PREMARIN) 1.25 MG tablet Take 1.25 mg by mouth daily.    . Fluticasone Furoate (ARNUITY ELLIPTA) 100 MCG/ACT AEPB Inhale 1 Dose into the lungs daily. Rinse, gargle, and  spit after use. 1 each 3  . ibuprofen (ADVIL) 800 MG tablet TAKE 1 TABLET BY MOUTH EVERY 8 HOURS AS NEEDED 30 tablet 0  . ibuprofen (ADVIL) 800 MG tablet Take 1 tablet (800 mg total) by mouth every 8 (eight) hours as needed. 30 tablet 0  . Ibuprofen-Famotidine 800-26.6 MG TABS Take 1 tablet by mouth every 8 (eight) hours as needed. 90 tablet 1  . meloxicam (MOBIC) 15 MG tablet Take 1 tablet (15 mg total) by mouth daily. 30 tablet 2  . olopatadine (PATANOL) 0.1 % ophthalmic solution Place 1 drop into both eyes 2 (two) times daily. 5 mL 4  . pantoprazole (PROTONIX) 40 MG tablet     . predniSONE (STERAPRED UNI-PAK 21 TAB) 10 MG (21) TBPK tablet Take as directed 21 tablet 0  . rosuvastatin (CRESTOR) 10 MG tablet Take 10 mg by mouth daily.    Marland Kitchen triamcinolone cream (KENALOG) 0.1 % TAKE 1 APPLICATION APPLIED TOPICALLY 3 TIMES A DAY FOR 7 DAY(S)    . VENTOLIN HFA 108 (90 Base) MCG/ACT inhaler     . Vitamin D, Ergocalciferol, (DRISDOL) 1.25 MG (50000 UT) CAPS capsule      Current Facility-Administered Medications on File Prior to Visit  Medication Dose Route Frequency Provider Last Rate Last Admin  . ipratropium-albuterol (DUONEB) 0.5-2.5 (3) MG/3ML nebulizer solution 3 mL  3 mL Nebulization Q6H Padgett, Rae Halsted, MD        Allergies  Allergen Reactions  . Ciprofloxacin Hcl  Swelling  . Propofol Other (See Comments)    Objective:  General: Alert and oriented x3 in no acute distress  Dermatology: No open lesions bilateral lower extremities, no webspace macerations, no ecchymosis bilateral, all nails x 10 are well manicured.  Vascular: Dorsalis Pedis and Posterior Tibial pedal pulses faintly palpable, Capillary Fill Time 3 seconds,(+) pedal hair growth bilateral, minimal edema left foot and ankle with varicosities and mild purple hue to the left greater than right foot and ankle unchanged.  Neurology: Johney Maine sensation intact via light touch bilateral. Protective and vibratory diminished on  the left, Positive Valleuix sign deep peroneal nerve distribution and Tinel's sign at the sural nerve branch of the left foot and ankle with shooting pain to the upper third of the left lateral leg.  Subjective numbness, tingling, burning, and sharp stabbing pain to all toes on the left like previous below the knee involving the lower one third of the leg with increased pain over the deep peroneal and sural nerve branches.  Musculoskeletal: Mild tenderness with palpation at lateral ankle>medial ankle with most pain today af distal and proximal fibula shaft of the fibula to the lower third and along the peroneal tendon course and lateral ankle ligaments on left like prior. No pain with calf compression bilateral. Motion guarded on left due to pain.  Strength within normal limits in all groups bilateral except left with guarding due to pain.  Gait: Unassisted  Assessment and Plan: Problem List Items Addressed This Visit   None   Visit Diagnoses    Complex regional pain syndrome type 1 of left lower extremity    -  Primary   Complex regional pain syndrome type 2 of left lower extremity       Injury of left ankle, sequela       Chronic pain of left ankle       Left foot pain       PVD (peripheral vascular disease) ([place])       Weakness          -Complete examination performed -Department of labor and duty paperwork completed on behalf of patient again this visit -Discussed continued care for her CRPS after injury at work -Awaiting final approval for disability -Awaiting physical medicine and rehab eval was never approved through work comp to have this done -Advised patient to continue with topicals like previous -Continue with good supportive shoes daily -Advised patient to continue with use of cane for stability even though patient did not have it with her today -Continue with Flexeril for muscle spasms and diclofenac for pain and inflammation given by orthopedic doctor -Return to office for  follow-up evaluation in 4 to 5 weeks or sooner if problems or issues arise.  Landis Martins, DPM

## 2020-02-25 ENCOUNTER — Encounter: Payer: Self-pay | Admitting: Gastroenterology

## 2020-03-14 ENCOUNTER — Other Ambulatory Visit: Payer: Self-pay

## 2020-03-14 ENCOUNTER — Ambulatory Visit (INDEPENDENT_AMBULATORY_CARE_PROVIDER_SITE_OTHER): Admitting: Sports Medicine

## 2020-03-14 ENCOUNTER — Encounter: Payer: Self-pay | Admitting: Sports Medicine

## 2020-03-14 DIAGNOSIS — G5772 Causalgia of left lower limb: Secondary | ICD-10-CM

## 2020-03-14 DIAGNOSIS — M779 Enthesopathy, unspecified: Secondary | ICD-10-CM

## 2020-03-14 DIAGNOSIS — L84 Corns and callosities: Secondary | ICD-10-CM

## 2020-03-14 DIAGNOSIS — S99912S Unspecified injury of left ankle, sequela: Secondary | ICD-10-CM

## 2020-03-14 DIAGNOSIS — M25572 Pain in left ankle and joints of left foot: Secondary | ICD-10-CM | POA: Diagnosis not present

## 2020-03-14 DIAGNOSIS — G90522 Complex regional pain syndrome I of left lower limb: Secondary | ICD-10-CM | POA: Diagnosis not present

## 2020-03-14 DIAGNOSIS — G5750 Tarsal tunnel syndrome, unspecified lower limb: Secondary | ICD-10-CM | POA: Insufficient documentation

## 2020-03-14 DIAGNOSIS — M79672 Pain in left foot: Secondary | ICD-10-CM

## 2020-03-14 DIAGNOSIS — R531 Weakness: Secondary | ICD-10-CM

## 2020-03-14 DIAGNOSIS — G8929 Other chronic pain: Secondary | ICD-10-CM

## 2020-03-14 DIAGNOSIS — I739 Peripheral vascular disease, unspecified: Secondary | ICD-10-CM

## 2020-03-14 HISTORY — DX: Tarsal tunnel syndrome, unspecified lower limb: G57.50

## 2020-03-14 MED ORDER — METHYLPREDNISOLONE 4 MG PO TBPK: ORAL_TABLET | ORAL | 0 refills | Status: DC

## 2020-03-14 NOTE — Progress Notes (Signed)
Subjective: CHABLIS LOSH is a 50 y.o. female patient who returns to office for follow-up evaluation of left ankle pain.  Patient had injury 04/19/2017 work-related and still has constant pain.  Patient reports last night had sharp shooting pain on the lateral side of the foot and lower leg like usual as well.  Patient reports that she did get approved for permanent disability and is waiting to hear back from her lawyer.  No other acute issues.  Patient Active Problem List   Diagnosis Date Noted  . Tarsal tunnel syndrome 03/14/2020  . Complex regional pain syndrome type II of left lower limb 02/28/2019  . Left lower quadrant pain 02/07/2019  . Neck mass 12/18/2018  . Referred ear pain, right 12/18/2018  . Cigarette smoker 08/15/2018  . Hoarse 08/15/2018  . Sialadenitis 08/15/2018  . Submandibular gland hypertrophy 08/15/2018  . Vocal cord polyp 08/15/2018  . Bilateral carotid artery stenosis 03/24/2017  . Left subclavian artery occlusion 03/24/2017  . Occlusion of vertebral artery 03/24/2017  . Chest pain 07/27/2016  . Ventricular premature beats 07/27/2016    Current Outpatient Medications on File Prior to Visit  Medication Sig Dispense Refill  . Acetaminophen (TYLENOL) 325 MG CAPS     . albuterol (PROVENTIL) (2.5 MG/3ML) 0.083% nebulizer solution Take 3 mLs (2.5 mg total) by nebulization every 4 (four) hours as needed for wheezing or shortness of breath. 75 mL 1  . Alpha-D-Galactosidase (BEANO) TABS     . amitriptyline (ELAVIL) 25 MG tablet Take 1 tablet (25 mg total) by mouth at bedtime. 30 tablet 1  . amitriptyline (ELAVIL) 25 MG tablet     . aspirin (GOODSENSE ASPIRIN) 325 MG tablet Take 325 mg by mouth daily.    . carisoprodol (SOMA) 250 MG tablet Take 250 mg by mouth 4 (four) times daily.    . cephALEXin (KEFLEX) 500 MG capsule Take 500 mg by mouth 4 (four) times daily.    . cetirizine-pseudoephedrine (ZYRTEC-D) 5-120 MG tablet Take 1 tablet by mouth 2 (two) times daily.      . cyclobenzaprine (FLEXERIL) 10 MG tablet Take 1 tablet (10 mg total) by mouth 3 (three) times daily as needed for muscle spasms. 30 tablet 0  . diclofenac (VOLTAREN) 75 MG EC tablet Take 75 mg by mouth 2 (two) times daily.    Marland Kitchen estrogens, conjugated, (PREMARIN) 1.25 MG tablet Take 1.25 mg by mouth daily.    . Fluticasone Furoate (ARNUITY ELLIPTA) 100 MCG/ACT AEPB Inhale 1 Dose into the lungs daily. Rinse, gargle, and spit after use. 1 each 3  . ibuprofen (ADVIL) 800 MG tablet TAKE 1 TABLET BY MOUTH EVERY 8 HOURS AS NEEDED 30 tablet 0  . ibuprofen (ADVIL) 800 MG tablet Take 1 tablet (800 mg total) by mouth every 8 (eight) hours as needed. 30 tablet 0  . Ibuprofen-Famotidine 800-26.6 MG TABS Take 1 tablet by mouth every 8 (eight) hours as needed. 90 tablet 1  . lactose hydrous POWD     . meloxicam (MOBIC) 15 MG tablet Take 1 tablet (15 mg total) by mouth daily. 30 tablet 2  . olopatadine (PATANOL) 0.1 % ophthalmic solution Place 1 drop into both eyes 2 (two) times daily. 5 mL 4  . pantoprazole (PROTONIX) 40 MG tablet     . predniSONE (STERAPRED UNI-PAK 21 TAB) 10 MG (21) TBPK tablet Take as directed 21 tablet 0  . rosuvastatin (CRESTOR) 10 MG tablet Take 10 mg by mouth daily.    Marland Kitchen triamcinolone cream (KENALOG)  0.1 % TAKE 1 APPLICATION APPLIED TOPICALLY 3 TIMES A DAY FOR 7 DAY(S)    . VENTOLIN HFA 108 (90 Base) MCG/ACT inhaler     . Vitamin D, Ergocalciferol, (DRISDOL) 1.25 MG (50000 UT) CAPS capsule      Current Facility-Administered Medications on File Prior to Visit  Medication Dose Route Frequency Provider Last Rate Last Admin  . ipratropium-albuterol (DUONEB) 0.5-2.5 (3) MG/3ML nebulizer solution 3 mL  3 mL Nebulization Q6H Padgett, Rae Halsted, MD        Allergies  Allergen Reactions  . Ciprofloxacin Hcl Swelling  . Propofol Other (See Comments)    Objective:  General: Alert and oriented x3 in no acute distress  Dermatology: No open lesions bilateral lower extremities, no  webspace macerations, no ecchymosis bilateral, all nails x 10 are well manicured, there is mild callus tissue noted plantar fifth metatarsal and plantar third toe on the left foot.  Vascular: Dorsalis Pedis and Posterior Tibial pedal pulses faintly palpable, Capillary Fill Time 3 seconds,(+) pedal hair growth bilateral, minimal edema left foot and ankle with varicosities and mild purple hue to the left greater than right foot and ankle unchanged.  Neurology: Johney Maine sensation intact via light touch bilateral. Protective and vibratory diminished on the left, Positive Valleuix sign deep peroneal nerve distribution and Tinel's sign at the sural nerve branch of the left foot and ankle with shooting pain to the upper third of the left lateral leg.  Subjective numbness, tingling, burning, and sharp stabbing pain to all toes on the left like previous below the knee involving the lower one third of the leg with increased pain over the deep peroneal and sural nerve branches.  Musculoskeletal: Moderate tenderness today to palpation at the sinus tarsi and anterior ankle on the left.  Mild tenderness with palpation at lateral ankle>medial ankle with pain as well along the distal and proximal fibula shaft of the fibula to the lower third and along the peroneal tendon course and lateral ankle ligaments on left like prior. No pain with calf compression bilateral. Motion guarded on left due to pain.  Strength within normal limits in all groups bilateral except left with guarding due to pain.  Gait: Unassisted  Assessment and Plan: Problem List Items Addressed This Visit   None   Visit Diagnoses    Complex regional pain syndrome type 1 of left lower extremity    -  Primary   Relevant Medications   Acetaminophen (TYLENOL) 325 MG CAPS   carisoprodol (SOMA) 250 MG tablet   diclofenac (VOLTAREN) 75 MG EC tablet   methylPREDNISolone (MEDROL DOSEPAK) 4 MG TBPK tablet   Complex regional pain syndrome type 2 of left lower  extremity       Relevant Medications   Acetaminophen (TYLENOL) 325 MG CAPS   carisoprodol (SOMA) 250 MG tablet   diclofenac (VOLTAREN) 75 MG EC tablet   methylPREDNISolone (MEDROL DOSEPAK) 4 MG TBPK tablet   Injury of left ankle, sequela       Chronic pain of left ankle       Relevant Medications   Acetaminophen (TYLENOL) 325 MG CAPS   carisoprodol (SOMA) 250 MG tablet   diclofenac (VOLTAREN) 75 MG EC tablet   methylPREDNISolone (MEDROL DOSEPAK) 4 MG TBPK tablet   Left foot pain       PVD (peripheral vascular disease) (HCC)       Weakness       Tendonitis       Callus of foot          -  Complete examination performed -Department of labor and duty paperwork completed on behalf of patient again this visit with continue recommendations of no work due to current permanent disability status -Discussed continued care for her CRPS after injury at work and new pains with swelling -Rx prednisone to take as instructed -Advised patient to continue with topicals like previous -Continue with good supportive shoes daily -Advised patient to continue with use of cane for stability even though patient did not have it with her today like before -At no additional charge mechanically debrided callus submet 5 and plantar third toe on the left using a 15 blade and medicated these areas with Salinocaine and Band-Aid to remove later this afternoon -Return to office for follow-up evaluation in 4 to 5 weeks or sooner if problems or issues arise.  Landis Martins, DPM

## 2020-04-11 ENCOUNTER — Ambulatory Visit: Payer: Medicaid Other | Admitting: Sports Medicine

## 2020-04-15 ENCOUNTER — Encounter: Payer: Self-pay | Admitting: Sports Medicine

## 2020-04-15 ENCOUNTER — Other Ambulatory Visit: Payer: Self-pay

## 2020-04-15 ENCOUNTER — Ambulatory Visit (INDEPENDENT_AMBULATORY_CARE_PROVIDER_SITE_OTHER): Admitting: Sports Medicine

## 2020-04-15 DIAGNOSIS — G90522 Complex regional pain syndrome I of left lower limb: Secondary | ICD-10-CM | POA: Diagnosis not present

## 2020-04-15 DIAGNOSIS — G8929 Other chronic pain: Secondary | ICD-10-CM

## 2020-04-15 DIAGNOSIS — M25572 Pain in left ankle and joints of left foot: Secondary | ICD-10-CM | POA: Diagnosis not present

## 2020-04-15 DIAGNOSIS — G5772 Causalgia of left lower limb: Secondary | ICD-10-CM

## 2020-04-15 DIAGNOSIS — M79672 Pain in left foot: Secondary | ICD-10-CM

## 2020-04-15 DIAGNOSIS — I739 Peripheral vascular disease, unspecified: Secondary | ICD-10-CM

## 2020-04-15 DIAGNOSIS — S99912S Unspecified injury of left ankle, sequela: Secondary | ICD-10-CM | POA: Diagnosis not present

## 2020-04-15 NOTE — Progress Notes (Signed)
Subjective: Sherri Arnold is a 50 y.o. female patient who returns to office for follow-up evaluation of left ankle pain.  Patient had injury 04/19/2017 work-related and still has pain unchanged from prior.  Patient reports that the prednisone did help with swelling but did not help with the pain.  Patient reports that she has an upcoming appointment with neurosurgeon on April 12.  Patient also reports that she met with her lawyer who reports that the duty paperwork only has to be filled out about every 3 months.  No other pedal complaints noted at this time.  Patient Active Problem List   Diagnosis Date Noted  . Tarsal tunnel syndrome 03/14/2020  . Complex regional pain syndrome type II of left lower limb 02/28/2019  . Left lower quadrant pain 02/07/2019  . Neck mass 12/18/2018  . Referred ear pain, right 12/18/2018  . Cigarette smoker 08/15/2018  . Hoarse 08/15/2018  . Sialadenitis 08/15/2018  . Submandibular gland hypertrophy 08/15/2018  . Vocal cord polyp 08/15/2018  . Bilateral carotid artery stenosis 03/24/2017  . Left subclavian artery occlusion 03/24/2017  . Occlusion of vertebral artery 03/24/2017  . Chest pain 07/27/2016  . Ventricular premature beats 07/27/2016    Current Outpatient Medications on File Prior to Visit  Medication Sig Dispense Refill  . Acetaminophen (TYLENOL) 325 MG CAPS     . albuterol (PROVENTIL) (2.5 MG/3ML) 0.083% nebulizer solution Take 3 mLs (2.5 mg total) by nebulization every 4 (four) hours as needed for wheezing or shortness of breath. 75 mL 1  . amitriptyline (ELAVIL) 25 MG tablet Take 1 tablet (25 mg total) by mouth at bedtime. 30 tablet 1  . amitriptyline (ELAVIL) 25 MG tablet     . aspirin (GOODSENSE ASPIRIN) 325 MG tablet Take 325 mg by mouth daily.    . cephALEXin (KEFLEX) 500 MG capsule Take 500 mg by mouth 4 (four) times daily.    . cetirizine-pseudoephedrine (ZYRTEC-D) 5-120 MG tablet Take 1 tablet by mouth 2 (two) times daily.     .  diclofenac (VOLTAREN) 75 MG EC tablet Take 75 mg by mouth 2 (two) times daily.    . Fluticasone Furoate (ARNUITY ELLIPTA) 100 MCG/ACT AEPB Inhale 1 Dose into the lungs daily. Rinse, gargle, and spit after use. 1 each 3  . ibuprofen (ADVIL) 800 MG tablet Take 1 tablet (800 mg total) by mouth every 8 (eight) hours as needed. 30 tablet 0  . methylPREDNISolone (MEDROL DOSEPAK) 4 MG TBPK tablet Take as directed 21 tablet 0  . olopatadine (PATANOL) 0.1 % ophthalmic solution Place 1 drop into both eyes 2 (two) times daily. 5 mL 4  . pantoprazole (PROTONIX) 40 MG tablet     . predniSONE (STERAPRED UNI-PAK 21 TAB) 10 MG (21) TBPK tablet Take as directed 21 tablet 0  . triamcinolone cream (KENALOG) 0.1 % TAKE 1 APPLICATION APPLIED TOPICALLY 3 TIMES A DAY FOR 7 DAY(S)    . VENTOLIN HFA 108 (90 Base) MCG/ACT inhaler     . Vitamin D, Ergocalciferol, (DRISDOL) 1.25 MG (50000 UT) CAPS capsule      Current Facility-Administered Medications on File Prior to Visit  Medication Dose Route Frequency Provider Last Rate Last Admin  . ipratropium-albuterol (DUONEB) 0.5-2.5 (3) MG/3ML nebulizer solution 3 mL  3 mL Nebulization Q6H Padgett, Rae Halsted, MD        Allergies  Allergen Reactions  . Ciprofloxacin Hcl Swelling  . Propofol Other (See Comments)    Objective:  General: Alert and oriented x3 in  no acute distress  Dermatology: No open lesions bilateral lower extremities, no webspace macerations, no ecchymosis bilateral, all nails x 10 are well manicured, minimal callus left foot.  Vascular: Dorsalis Pedis and Posterior Tibial pedal pulses faintly palpable, Capillary Fill Time 3 seconds,(+) pedal hair growth bilateral, minimal edema left foot and ankle with varicosities and mild purple hue to the left greater than right foot and ankle unchanged from prior however with elevation and normal color slowly comes back to the toes.  Neurology: Johney Maine sensation intact via light touch bilateral.  Subjective  sharp shooting pains over the deep peroneal and superficial peroneal nerve courses of the left foot and ankle.  Musculoskeletal: Moderate tenderness today to palpation at the sinus tarsi and anterior ankle on the left.  Mild tenderness with palpation at lateral ankle>medial ankle with pain as well along the distal and proximal fibula shaft of the fibula to the lower third and along the peroneal tendon course and lateral ankle ligaments on left like prior. No pain with calf compression bilateral. Motion guarded on left due to pain.  Strength within normal limits in all groups bilateral except left with guarding due to pain.  Gait: Unassisted  Assessment and Plan: Problem List Items Addressed This Visit   None   Visit Diagnoses    Complex regional pain syndrome type 1 of left lower extremity    -  Primary   Complex regional pain syndrome type 2 of left lower extremity       Injury of left ankle, sequela       Chronic pain of left ankle       Left foot pain       PVD (peripheral vascular disease) ([place])          -Complete examination performed -Department of labor and duty paperwork completed on behalf of patient again this visit with continue recommendations of no work due to current permanent disability status of which we will complete every 90 days heel after -Discussed continued care for her CRPS after injury at work  -Advised patient if swelling recurs to call for prescription of prednisone -Advised patient to continue with topicals like previous -Continue with good supportive shoes daily -Advised patient to continue with use of cane for stability even though patient did not have it with her today like before -Patient is awaiting follow-up from neurosurgeon as an appointment on April 12 -Return to office for follow-up evaluation in 8 weeks or sooner if problems or issues arise.  Landis Martins, DPM

## 2020-06-12 ENCOUNTER — Encounter: Payer: Self-pay | Admitting: *Deleted

## 2020-06-12 ENCOUNTER — Encounter: Payer: Self-pay | Admitting: Cardiology

## 2020-06-17 ENCOUNTER — Other Ambulatory Visit: Payer: Self-pay

## 2020-06-17 ENCOUNTER — Encounter: Payer: Self-pay | Admitting: Sports Medicine

## 2020-06-17 ENCOUNTER — Ambulatory Visit (INDEPENDENT_AMBULATORY_CARE_PROVIDER_SITE_OTHER): Admitting: Sports Medicine

## 2020-06-17 DIAGNOSIS — M79672 Pain in left foot: Secondary | ICD-10-CM

## 2020-06-17 DIAGNOSIS — S99912S Unspecified injury of left ankle, sequela: Secondary | ICD-10-CM | POA: Diagnosis not present

## 2020-06-17 DIAGNOSIS — M25572 Pain in left ankle and joints of left foot: Secondary | ICD-10-CM | POA: Diagnosis not present

## 2020-06-17 DIAGNOSIS — G8929 Other chronic pain: Secondary | ICD-10-CM

## 2020-06-17 DIAGNOSIS — G5772 Causalgia of left lower limb: Secondary | ICD-10-CM | POA: Diagnosis not present

## 2020-06-17 DIAGNOSIS — G90522 Complex regional pain syndrome I of left lower limb: Secondary | ICD-10-CM | POA: Diagnosis not present

## 2020-06-17 DIAGNOSIS — I739 Peripheral vascular disease, unspecified: Secondary | ICD-10-CM

## 2020-06-17 NOTE — Progress Notes (Signed)
Subjective: Sherri Arnold is a 50 y.o. female patient who returns to office for follow-up evaluation of left ankle pain.  Patient had injury 04/19/2017 work-related and still has pain however reports that her pain is a little different noticing that it is worse in the morning and more constant and radiates up the back of the leg to about the back of her mid thigh.  Patient also reports that she had an episode of some skin discoloration and some increased swelling since the days have gotten a little warmer.  No other pedal complaints noted at this time.  Patient Active Problem List   Diagnosis Date Noted  . Tarsal tunnel syndrome 03/14/2020  . Complex regional pain syndrome type II of left lower limb 02/28/2019  . Left lower quadrant pain 02/07/2019  . Neck mass 12/18/2018  . Referred ear pain, right 12/18/2018  . Cigarette smoker 08/15/2018  . Hoarse 08/15/2018  . Sialadenitis 08/15/2018  . Submandibular gland hypertrophy 08/15/2018  . Vocal cord polyp 08/15/2018  . Bilateral carotid artery stenosis 03/24/2017  . Left subclavian artery occlusion 03/24/2017  . Occlusion of vertebral artery 03/24/2017  . Chest pain 07/27/2016  . Ventricular premature beats 07/27/2016    Current Outpatient Medications on File Prior to Visit  Medication Sig Dispense Refill  . atorvastatin (LIPITOR) 80 MG tablet Take 80 mg by mouth daily.    . cetirizine (ZYRTEC) 10 MG tablet Take 10 mg by mouth daily.    . meloxicam (MOBIC) 7.5 MG tablet Take 7.5 mg by mouth daily.    . Vitamin D, Ergocalciferol, (DRISDOL) 1.25 MG (50000 UT) CAPS capsule Take 50,000 Units by mouth every 7 (seven) days.     Current Facility-Administered Medications on File Prior to Visit  Medication Dose Route Frequency Provider Last Rate Last Admin  . ipratropium-albuterol (DUONEB) 0.5-2.5 (3) MG/3ML nebulizer solution 3 mL  3 mL Nebulization Q6H Padgett, Rae Halsted, MD        Allergies  Allergen Reactions  . Ciprofloxacin Hcl  Swelling  . Propofol Other (See Comments)  . Propranolol   . Wellbutrin [Bupropion]     Objective:  General: Alert and oriented x3 in no acute distress  Dermatology: No open lesions bilateral lower extremities, no webspace macerations, no ecchymosis bilateral, all nails x 10 are well manicured, minimal callus left foot.  Vascular: Dorsalis Pedis and Posterior Tibial pedal pulses faintly palpable, Capillary Fill Time 3 seconds,(+) pedal hair growth bilateral, minimal edema left foot and ankle with varicosities and mild purple hue to the left greater than right foot and ankle unchanged from prior however with elevation and normal color slowly comes back to the toes like before and resolved noted on the left.  Neurology: Johney Maine sensation intact via light touch bilateral.  Subjective sharp shooting pains over the deep peroneal and superficial peroneal nerve courses of the left foot and ankle.  Musculoskeletal: Moderate tenderness today to palpation at the sinus tarsi and anterior ankle on the left.  Mild tenderness with palpation at lateral ankle>medial ankle with pain as well along the distal and proximal fibula shaft of the fibula to the lower third and along the peroneal tendon course and lateral ankle ligaments on left like prior however pain is extending to her calf and posterior thigh.  Thompson sign negative.  Motion guarded on left due to pain.  Strength within normal limits in all groups bilateral except left with guarding due to pain.  Gait: Unassisted  Assessment and Plan: Problem List Items  Addressed This Visit   None   Visit Diagnoses    Complex regional pain syndrome type 1 of left lower extremity    -  Primary   Complex regional pain syndrome type 2 of left lower extremity       Injury of left ankle, sequela       Chronic pain of left ankle       Left foot pain       PVD (peripheral vascular disease) (HCC)          -Complete examination performed -Department of labor and  duty paperwork completed on behalf of patient again this visit with continue recommendations of no work due to current permanent disability status of which we will complete every 90 days like before -Discussed continued care for her CRPS after injury at work  -Advised patient to consider neuromodulation -Advised patient to continue with topicals like previous -Continue with good supportive shoes daily -Advised patient to continue with use of cane for stability even though patient did not have it with her today like before and reports that now that her shoulder is doing better she can now use her cane -Return to office for follow-up evaluation in 8-12 weeks or sooner if problems or issues arise.  Landis Martins, DPM

## 2020-06-18 DIAGNOSIS — R059 Cough, unspecified: Secondary | ICD-10-CM | POA: Insufficient documentation

## 2020-06-18 DIAGNOSIS — R413 Other amnesia: Secondary | ICD-10-CM | POA: Insufficient documentation

## 2020-06-18 DIAGNOSIS — I639 Cerebral infarction, unspecified: Secondary | ICD-10-CM | POA: Insufficient documentation

## 2020-06-18 DIAGNOSIS — R002 Palpitations: Secondary | ICD-10-CM | POA: Insufficient documentation

## 2020-06-18 DIAGNOSIS — M62830 Muscle spasm of back: Secondary | ICD-10-CM | POA: Insufficient documentation

## 2020-06-18 DIAGNOSIS — M791 Myalgia, unspecified site: Secondary | ICD-10-CM | POA: Insufficient documentation

## 2020-06-18 DIAGNOSIS — C801 Malignant (primary) neoplasm, unspecified: Secondary | ICD-10-CM | POA: Insufficient documentation

## 2020-06-18 DIAGNOSIS — K219 Gastro-esophageal reflux disease without esophagitis: Secondary | ICD-10-CM | POA: Insufficient documentation

## 2020-06-18 DIAGNOSIS — R0989 Other specified symptoms and signs involving the circulatory and respiratory systems: Secondary | ICD-10-CM | POA: Insufficient documentation

## 2020-06-18 DIAGNOSIS — L659 Nonscarring hair loss, unspecified: Secondary | ICD-10-CM | POA: Insufficient documentation

## 2020-06-18 DIAGNOSIS — E782 Mixed hyperlipidemia: Secondary | ICD-10-CM | POA: Insufficient documentation

## 2020-06-18 DIAGNOSIS — J449 Chronic obstructive pulmonary disease, unspecified: Secondary | ICD-10-CM | POA: Insufficient documentation

## 2020-06-18 DIAGNOSIS — I4891 Unspecified atrial fibrillation: Secondary | ICD-10-CM | POA: Insufficient documentation

## 2020-06-18 DIAGNOSIS — I2510 Atherosclerotic heart disease of native coronary artery without angina pectoris: Secondary | ICD-10-CM | POA: Insufficient documentation

## 2020-06-23 ENCOUNTER — Other Ambulatory Visit: Payer: Self-pay

## 2020-06-24 ENCOUNTER — Encounter: Payer: Self-pay | Admitting: Cardiology

## 2020-06-24 ENCOUNTER — Ambulatory Visit (INDEPENDENT_AMBULATORY_CARE_PROVIDER_SITE_OTHER): Payer: Medicare Other

## 2020-06-24 ENCOUNTER — Ambulatory Visit: Payer: Medicare Other | Admitting: Cardiology

## 2020-06-24 VITALS — BP 98/72 | HR 84 | Ht 63.0 in | Wt 146.6 lb

## 2020-06-24 DIAGNOSIS — Z720 Tobacco use: Secondary | ICD-10-CM

## 2020-06-24 DIAGNOSIS — R079 Chest pain, unspecified: Secondary | ICD-10-CM

## 2020-06-24 DIAGNOSIS — R5383 Other fatigue: Secondary | ICD-10-CM | POA: Diagnosis not present

## 2020-06-24 DIAGNOSIS — R002 Palpitations: Secondary | ICD-10-CM

## 2020-06-24 DIAGNOSIS — Z8673 Personal history of transient ischemic attack (TIA), and cerebral infarction without residual deficits: Secondary | ICD-10-CM | POA: Diagnosis not present

## 2020-06-24 DIAGNOSIS — E785 Hyperlipidemia, unspecified: Secondary | ICD-10-CM

## 2020-06-24 DIAGNOSIS — R0602 Shortness of breath: Secondary | ICD-10-CM | POA: Diagnosis not present

## 2020-06-24 MED ORDER — NITROGLYCERIN 0.4 MG SL SUBL: 0.4000 mg | SUBLINGUAL_TABLET | SUBLINGUAL | 3 refills | Status: AC | PRN

## 2020-06-24 NOTE — Patient Instructions (Addendum)
Medication Instructions:  Your physician has recommended you make the following change in your medication:  START: Nitroglycerin 0.4 mg take one tablet by mouth every 5 minutes up to three times as needed for chest pain.   *If you need a refill on your cardiac medications before your next appointment, please call your pharmacy*   Lab Work: Your physician recommends that you return for lab work: 3-7 days before CT: BMET, Mag If you have labs (blood work) drawn today and your tests are completely normal, you will receive your results only by: [place] (if you have MyChart) OR A paper copy in the mail If you have any lab test that is abnormal or we need to change your treatment, we will call you to review the results.   Testing/Procedures: Your cardiac CT will be scheduled at one of the below locations:   [facility] [address] [place], [place] [address] [phone]  If scheduled at [facility], please arrive at the [facility] main entrance (entrance A) of [facility] 30 minutes prior to test start time. Proceed to the [facility] Radiology Department (first floor) to check-in and test prep.   Please follow these instructions carefully (unless otherwise directed):  On the Night Before the Test: Be sure to Drink plenty of water. Do not consume any caffeinated/decaffeinated beverages or chocolate 12 hours prior to your test. Do not take any antihistamines 12 hours prior to your test.   On the Day of the Test: Drink plenty of water until 1 hour prior to the test. Do not eat any food 4 hours prior to the test. You may take your regular medications prior to the test.  FEMALES- please wear underwire-free bra if available.       After the Test: Drink plenty of water. After receiving IV contrast, you may experience a mild flushed feeling. This is normal. On occasion, you may experience a mild rash up to 24 hours after the test. This is not  dangerous. If this occurs, you can take Benadryl 25 mg and increase your fluid intake. If you experience trouble breathing, this can be serious. If it is severe call 911 IMMEDIATELY. If it is mild, please call our office. If you take any of these medications: Glipizide/Metformin, Avandament, Glucavance, please do not take 48 hours after completing test unless otherwise instructed.  A zio monitor was ordered today. It will remain on for 14 days. You will then return monitor and event diary in provided box. It takes 1-2 weeks for report to be downloaded and returned to [place]. We will call you with the results. If monitor falls off or has orange flashing light, please call Zio for further instructions.     Once we have confirmed authorization from your insurance company, we will call you to set up a date and time for your test. Based on how quickly your insurance processes prior authorizations requests, please allow up to 4 weeks to be contacted for scheduling your Cardiac CT appointment. Be advised that routine Cardiac CT appointments could be scheduled as many as 8 weeks after your provider has ordered it.  For non-scheduling related questions, please contact the cardiac imaging nurse navigator should you have any questions/concerns: Marchia Bond, Cardiac Imaging Nurse Navigator Gordy Clement, Cardiac Imaging Nurse Navigator [facility] Direct Office Dial: [phone]   For scheduling needs, including cancellations and rescheduling, please call [place], [phone].    Follow-Up: At [facility],  you and your health needs are our priority.  As part of our continuing mission to provide you with exceptional heart care, we have created designated Provider Care Teams.  These Care Teams include your primary Cardiologist (physician) and Advanced Practice Providers (APPs -  Physician Assistants and Nurse Practitioners) who all work together to provide you with the care you  need, when you need it.  We recommend signing up for the patient portal called "MyChart".  Sign up information is provided on this After Visit Summary.  MyChart is used to connect with patients for Virtual Visits (Telemedicine).  Patients are able to view lab/test results, encounter notes, upcoming appointments, etc.  Non-urgent messages can be sent to your provider as well.   To learn more about what you can do with MyChart, go to [email or web address].    Your next appointment:   12 week(s)  The format for your next appointment:   In Person  Provider:   Berniece Salines, DO   Other Instructions ZIO  WHY IS MY DOCTOR PRESCRIBING ZIO? The Zio system is proven and trusted by physicians to detect and diagnose irregular heart rhythms -- and has been prescribed to hundreds of thousands of patients.  The FDA has cleared the Zio system to monitor for many different kinds of irregular heart rhythms. In a study, physicians were able to reach a diagnosis 90% of the time with the Zio system1.  You can wear the Zio monitor -- a small, discreet, comfortable patch -- during your normal day-to-day activity, including while you sleep, shower, and exercise, while it records every single heartbeat for analysis.  1Barrett, P., et al. Comparison of 24 Hour Holter Monitoring Versus 14 Day Novel Adhesive Patch Electrocardiographic Monitoring. [place], 2014.  ZIO VS. HOLTER MONITORING The Zio monitor can be comfortably worn for up to 14 days. Holter monitors can be worn for 24 to 48 hours, limiting the time to record any irregular heart rhythms you may have. Zio is able to capture data for the 51% of patients who have their first symptom-triggered arrhythmia after 48 hours.1  LIVE WITHOUT RESTRICTIONS The Zio ambulatory cardiac monitor is a small, unobtrusive, and water-resistant patch--you might even forget you're wearing it. The Zio monitor records and stores every beat of your heart,  whether you're sleeping, working out, or showering. Remove on: June 28th 2022

## 2020-06-24 NOTE — Progress Notes (Addendum)
Cardiology Office Note:    Date:  06/24/2020   ID:  Sherri Arnold, DOB November 11, 1970, MRN [id number]  PCP:  Sherri May, NP  Cardiologist:  Berniece Salines, DO  Electrophysiologist:  None   Referring MD: Sherri May, NP   No chief complaint on file. I have been experiencing significant heart palpitations with chest discomfort.  History of Present Illness:    Sherri Arnold is a 50 y.o. female with a hx of CVA, TIA, hyperlipidemia, smoker, family history of coronary artery disease is here today to be evaluated for shortness of breath as well as chest discomfort and palpitations. The patient tells me that over the last several months she has had intermittent palpitations.  She notes that they have been infrequent and is causing her a great deal of discomfort.  She describes an abrupt onset of fast heartbeat symptoms which last for minutes at a time.  Usually she tells me is usually in the evening and sometimes she has had intermittent episodes during the daytime.  In addition the patient reports that she has been experiencing intermittent chest discomfort.  She described as a midsternal burning and sometimes stabbing pain.  Nothing makes it better or worse.  But she has had associated shortness of breath, nausea as well as numbness and tingling at time.  She has not had any radiation.  She tells me that sometimes the pain can last up to an hour and sometimes less.   Past Medical History:  Diagnosis Date   A-fib [facility])    Bilateral carotid artery stenosis 03/24/2017   Cancer (HCC)    Chest pain 07/27/2016   Chronic pain syndrome    Cigarette smoker 08/15/2018   Complex regional pain syndrome type II of left lower limb 02/28/2019   COPD (chronic obstructive pulmonary disease) (HCC)    Coronary artery disease    Cough    Dizziness    GERD (gastroesophageal reflux disease)    IBS (irritable bowel syndrome)    Left lower quadrant pain 02/07/2019   Left subclavian artery occlusion  03/24/2017   Memory loss    Mixed hyperlipidemia    Muscle pain    Muscle spasm of back    Neck mass 12/18/2018   Nonscarring hair loss, unspecified    Occlusion of vertebral artery 03/24/2017   Palpitations    Poor circulation    Referred ear pain, right 12/18/2018   Sialadenitis 08/15/2018   Stroke ([facility])    mini-stroke   Submandibular gland hypertrophy 08/15/2018   Tarsal tunnel syndrome 03/14/2020   Ventricular premature beats 07/27/2016   Vocal cord polyp 08/15/2018    Past Surgical History:  Procedure Laterality Date   EYE SURGERY     NASAL SINUS SURGERY      Current Medications: Current Meds  Medication Sig   atorvastatin (LIPITOR) 80 MG tablet Take 80 mg by mouth daily.   cetirizine (ZYRTEC) 10 MG tablet Take 10 mg by mouth daily as needed for allergies.   meloxicam (MOBIC) 7.5 MG tablet Take 7.5 mg by mouth daily.   nitroGLYCERIN (NITROSTAT) 0.4 MG SL tablet Place 1 tablet (0.4 mg total) under the tongue every 5 (five) minutes as needed for chest pain.   Vitamin D, Ergocalciferol, (DRISDOL) 1.25 MG (50000 UT) CAPS capsule Take 50,000 Units by mouth every 7 (seven) days.   Current Facility-Administered Medications for the 06/24/20 encounter (Office Visit) with Berniece Salines, DO  Medication   ipratropium-albuterol (DUONEB) 0.5-2.5 (3) MG/3ML nebulizer solution  3 mL     Allergies:   Ciprofloxacin hcl, Propofol, Propranolol, and Wellbutrin [bupropion]   Social History   Socioeconomic History   Marital status: Single    Spouse name: Not on file   Number of children: Not on file   Years of education: Not on file   Highest education level: Not on file  Occupational History   Not on file  Tobacco Use   Smoking status: Every Day    Packs/day: 0.25    Years: 33.00    Pack years: 8.25    Types: Cigarettes   Smokeless tobacco: Never  Vaping Use   Vaping Use: Never used  Substance and Sexual Activity   Alcohol use: No    Comment: Occasional   Drug use: No   Sexual  activity: Not on file  Other Topics Concern   Not on file  Social History Narrative   Not on file   Social Determinants of Health   Financial Resource Strain: Not on file  Food Insecurity: Not on file  Transportation Needs: Not on file  Physical Activity: Not on file  Stress: Not on file  Social Connections: Not on file     Family History: The patient's family history includes COPD in her mother; Diabetes in her maternal grandmother and paternal grandmother; Heart disease in her paternal grandfather.  ROS:   Review of Systems  Constitution: Negative for decreased appetite, fever and weight gain.  HENT: Negative for congestion, ear discharge, hoarse voice and sore throat.   Eyes: Negative for discharge, redness, vision loss in right eye and visual halos.  Cardiovascular: Negative for chest pain, dyspnea on exertion, leg swelling, orthopnea and palpitations.  Respiratory: Negative for cough, hemoptysis, shortness of breath and snoring.   Endocrine: Negative for heat intolerance and polyphagia.  Hematologic/Lymphatic: Negative for bleeding problem. Does not bruise/bleed easily.  Skin: Negative for flushing, nail changes, rash and suspicious lesions.  Musculoskeletal: Negative for arthritis, joint pain, muscle cramps, myalgias, neck pain and stiffness.  Gastrointestinal: Negative for abdominal pain, bowel incontinence, diarrhea and excessive appetite.  Genitourinary: Negative for decreased libido, genital sores and incomplete emptying.  Neurological: Negative for brief paralysis, focal weakness, headaches and loss of balance.  Psychiatric/Behavioral: Negative for altered mental status, depression and suicidal ideas.  Allergic/Immunologic: Negative for HIV exposure and persistent infections.    EKGs/Labs/Other Studies Reviewed:    The following studies were reviewed today:   EKG:  The ekg ordered today demonstrates monitoring, heart rate 84 bpm with low voltage  Bilateral  carotid ultrasound June 2018.  Blood flow within the right internal carotid artery are consistent with 1-39% stenosis.  Doppler velocities in the left internal carotid artery are consistent with 1-39 stenosis.  No color-flow spectral Doppler signals were obtainable in the left vertebral artery suggestive of occlusion.  Echocardiogram done in April 2018 is reported with LVEF greater than 55%.Grade 1 diastolic dysfunction.  Right ventricle is normal in size and function.  Left atrium is normal in size.  Right atrium is normal in size and function.  Mobile interatrial septum with breathalyze shunt at the recycles.  Aortic valve is trileaflet.  No aortic regurgitation or aortic stenosis.  No mitral regurgitation or mitral stenosis.  There was no pericardial fusion. Recent Labs: No results found for requested labs within last 8760 hours.  Recent Lipid Panel No results found for: CHOL, TRIG, HDL, CHOLHDL, VLDL, LDLCALC, LDLDIRECT  Physical Exam:    VS:  BP 98/72   Pulse 84  Ht 5\' 3"  (1.6 m)   Wt 146 lb 9.6 oz (66.5 kg)   SpO2 98%   BMI 25.97 kg/m     Wt Readings from Last 3 Encounters:  06/24/20 146 lb 9.6 oz (66.5 kg)  06/11/20 150 lb (68 kg)  02/01/17 144 lb 12.8 oz (65.7 kg)     GEN: Well nourished, well developed in no acute distress HEENT: Normal NECK: No JVD; No carotid bruits LYMPHATICS: No lymphadenopathy CARDIAC: S1S2 noted,RRR, no murmurs, rubs, gallops RESPIRATORY:  Clear to auscultation without rales, wheezing or rhonchi  ABDOMEN: Soft, non-tender, non-distended, +bowel sounds, no guarding. EXTREMITIES: No edema, No cyanosis, no clubbing MUSCULOSKELETAL:  No deformity  SKIN: Warm and dry NEUROLOGIC:  Alert and oriented x 3, non-focal PSYCHIATRIC:  Normal affect, good insight  ASSESSMENT:    1. Chest pain of uncertain etiology   2. Palpitations   3. History of CVA (cerebrovascular accident)   4. SOB (shortness of breath)   5. Fatigue, unspecified type   6.  Hyperlipidemia, unspecified hyperlipidemia type   7. Tobacco use   8. Chest pain, unspecified type    PLAN:     The patient did have a stress echocardiogram in September 2017 unfortunately I am unable to see the results but the patient told me she was advised that this testing was normal.  Therefore at this time the symptoms chest pain and shortness of breath is concerning, this patient does have intermediate risk for coronary artery disease and at this time I would like to pursue an ischemic evaluation in this patient.  Shared decision a coronary CTA at this time is appropriate.  I have discussed with the patient about the testing.  The patient has no IV contrast allergy and is agreeable to proceed with this test.  Sublingual nitroglycerin prescription was sent, its protocol and 911 protocol explained and the patient vocalized understanding questions were answered to the patient's satisfaction  Her echocardiogram done in February 2018 shows LVEF greater than 55%.  We will hold off on repeat any echocardiograms at this time.  I would like to rule out a cardiovascular etiology of this palpitation, therefore at this time I would like to placed a zio patch for   14 days.  The patient was counseled on tobacco cessation today for 5 minutes.  Counseling included reviewing the risks of smoking tobacco products, how it impacts the patient's current medical diagnoses and different strategies for quitting.  Pharmacotherapy to aid in tobacco cessation was not prescribed today. The patient coordinate with  primary care provider.  The patient was also advised to call  1-800-QUIT-NOW ([phone]) for additional help with quitting smoking.   The patient is in agreement with the above plan. The patient left the office in stable condition.  The patient will follow up in   Medication Adjustments/Labs and Tests Ordered: Current medicines are reviewed at length with the patient today.  Concerns regarding  medicines are outlined above.  Orders Placed This Encounter  Procedures   CT CORONARY MORPH W/CTA COR W/SCORE W/CA W/CM &/OR WO/CM   Basic metabolic panel   Magnesium   LONG TERM MONITOR (3-14 DAYS)   EKG 12-Lead   Meds ordered this encounter  Medications   nitroGLYCERIN (NITROSTAT) 0.4 MG SL tablet    Sig: Place 1 tablet (0.4 mg total) under the tongue every 5 (five) minutes as needed for chest pain.    Dispense:  90 tablet    Refill:  3    Patient  Instructions  Medication Instructions:  Your physician has recommended you make the following change in your medication:  START: Nitroglycerin 0.4 mg take one tablet by mouth every 5 minutes up to three times as needed for chest pain.   *If you need a refill on your cardiac medications before your next appointment, please call your pharmacy*   Lab Work: Your physician recommends that you return for lab work: 3-7 days before CT: BMET, Mag If you have labs (blood work) drawn today and your tests are completely normal, you will receive your results only by: [place] (if you have MyChart) OR A paper copy in the mail If you have any lab test that is abnormal or we need to change your treatment, we will call you to review the results.   Testing/Procedures: Your cardiac CT will be scheduled at one of the below locations:   [facility] [address] [place], [place] [address] [phone]  If scheduled at [facility], please arrive at the [facility] main entrance (entrance A) of [facility] 30 minutes prior to test start time. Proceed to the [facility] Radiology Department (first floor) to check-in and test prep.   Please follow these instructions carefully (unless otherwise directed):  On the Night Before the Test: Be sure to Drink plenty of water. Do not consume any caffeinated/decaffeinated beverages or chocolate 12 hours prior to your test. Do not take any antihistamines 12 hours prior  to your test.   On the Day of the Test: Drink plenty of water until 1 hour prior to the test. Do not eat any food 4 hours prior to the test. You Arnold take your regular medications prior to the test.  FEMALES- please wear underwire-free bra if available.       After the Test: Drink plenty of water. After receiving IV contrast, you Arnold experience a mild flushed feeling. This is normal. On occasion, you Arnold experience a mild rash up to 24 hours after the test. This is not dangerous. If this occurs, you can take Benadryl 25 mg and increase your fluid intake. If you experience trouble breathing, this can be serious. If it is severe call 911 IMMEDIATELY. If it is mild, please call our office. If you take any of these medications: Glipizide/Metformin, Avandament, Glucavance, please do not take 48 hours after completing test unless otherwise instructed.  A zio monitor was ordered today. It will remain on for 14 days. You will then return monitor and event diary in provided box. It takes 1-2 weeks for report to be downloaded and returned to [place]. We will call you with the results. If monitor falls off or has orange flashing light, please call Zio for further instructions.     Once we have confirmed authorization from your insurance company, we will call you to set up a date and time for your test. Based on how quickly your insurance processes prior authorizations requests, please allow up to 4 weeks to be contacted for scheduling your Cardiac CT appointment. Be advised that routine Cardiac CT appointments could be scheduled as many as 8 weeks after your provider has ordered it.  For non-scheduling related questions, please contact the cardiac imaging nurse navigator should you have any questions/concerns: Marchia Bond, Cardiac Imaging Nurse Navigator Gordy Clement, Cardiac Imaging Nurse Navigator [facility] Direct Office Dial: [phone]   For scheduling needs,  including cancellations and rescheduling, please call [place], [phone].    Follow-Up:  At [facility], you and your health needs are our priority.  As part of our continuing mission to provide you with exceptional heart care, we have created designated Provider Care Teams.  These Care Teams include your primary Cardiologist (physician) and Advanced Practice Providers (APPs -  Physician Assistants and Nurse Practitioners) who all work together to provide you with the care you need, when you need it.  We recommend signing up for the patient portal called "MyChart".  Sign up information is provided on this After Visit Summary.  MyChart is used to connect with patients for Virtual Visits (Telemedicine).  Patients are able to view lab/test results, encounter notes, upcoming appointments, etc.  Non-urgent messages can be sent to your provider as well.   To learn more about what you can do with MyChart, go to [email or web address].    Your next appointment:   12 week(s)  The format for your next appointment:   In Person  Provider:   Berniece Salines, DO   Other Instructions ZIO  WHY IS MY DOCTOR PRESCRIBING ZIO? The Zio system is proven and trusted by physicians to detect and diagnose irregular heart rhythms -- and has been prescribed to hundreds of thousands of patients.  The FDA has cleared the Zio system to monitor for many different kinds of irregular heart rhythms. In a study, physicians were able to reach a diagnosis 90% of the time with the Zio system1.  You can wear the Zio monitor -- a small, discreet, comfortable patch -- during your normal day-to-day activity, including while you sleep, shower, and exercise, while it records every single heartbeat for analysis.  1Barrett, P., et al. Comparison of 24 Hour Holter Monitoring Versus 14 Day Novel Adhesive Patch Electrocardiographic Monitoring. [place], 2014.  ZIO VS. HOLTER MONITORING The Zio monitor can be  comfortably worn for up to 14 days. Holter monitors can be worn for 24 to 48 hours, limiting the time to record any irregular heart rhythms you Arnold have. Zio is able to capture data for the 51% of patients who have their first symptom-triggered arrhythmia after 48 hours.1  LIVE WITHOUT RESTRICTIONS The Zio ambulatory cardiac monitor is a small, unobtrusive, and water-resistant patch--you might even forget you're wearing it. The Zio monitor records and stores every beat of your heart, whether you're sleeping, working out, or showering. Remove on: June 28th 2022    Adopting a Healthy Lifestyle.  Know what a healthy weight is for you (roughly BMI <25) and aim to maintain this   Aim for 7+ servings of fruits and vegetables daily   65-80+ fluid ounces of water or unsweet tea for healthy kidneys   Limit to max 1 drink of alcohol per day; avoid smoking/tobacco   Limit animal fats in diet for cholesterol and heart health - choose grass fed whenever available   Avoid highly processed foods, and foods high in saturated/trans fats   Aim for low stress - take time to unwind and care for your mental health   Aim for 150 min of moderate intensity exercise weekly for heart health, and weights twice weekly for bone health   Aim for 7-9 hours of sleep daily   When it comes to diets, agreement about the perfect plan isnt easy to find, even among the experts. Experts at the [employer] developed an idea known as the Healthy Eating Plate. Just imagine a plate divided into logical, healthy portions.   The emphasis is on diet quality:  Load up on vegetables and fruits - one-half of your plate: Aim for color and variety, and remember that potatoes dont count.   Go for whole grains - one-quarter of your plate: Whole wheat, barley, wheat berries, quinoa, oats, brown rice, and foods made with them. If you want pasta, go with whole wheat pasta.   Protein power - one-quarter of your  plate: Fish, chicken, beans, and nuts are all healthy, versatile protein sources. Limit red meat.   The diet, however, does go beyond the plate, offering a few other suggestions.   Use healthy plant oils, such as olive, canola, soy, corn, sunflower and peanut. Check the labels, and avoid partially hydrogenated oil, which have unhealthy trans fats.   If youre thirsty, drink water. Coffee and tea are good in moderation, but skip sugary drinks and limit milk and dairy products to one or two daily servings.   The type of carbohydrate in the diet is more important than the amount. Some sources of carbohydrates, such as vegetables, fruits, whole grains, and beans-are healthier than others.   Finally, stay active  Signed, Berniece Salines, DO  06/24/2020 11:01 AM    [place]

## 2020-06-25 ENCOUNTER — Telehealth: Payer: Self-pay

## 2020-06-25 MED ORDER — IVABRADINE HCL 5 MG PO TABS: ORAL_TABLET | ORAL | 0 refills | Status: DC

## 2020-06-25 NOTE — Telephone Encounter (Signed)
Called patient to inform her a prescription for one Ivabradine was sent to [facility] for her to take 2 hours before her CT scan. She verbalized understanding, no questions expressed at this time.

## 2020-06-25 NOTE — Addendum Note (Signed)
Addended by: Orvan July on: 06/25/2020 05:20 PM   Modules accepted: Orders

## 2020-07-01 ENCOUNTER — Other Ambulatory Visit: Payer: Self-pay | Admitting: Cardiology

## 2020-07-01 ENCOUNTER — Telehealth: Payer: Self-pay | Admitting: Cardiology

## 2020-07-02 LAB — BASIC METABOLIC PANEL
BUN/Creatinine Ratio: 24 — ABNORMAL HIGH (ref 9–23)
BUN: 20 mg/dL (ref 6–24)
CO2: 23 mmol/L (ref 20–29)
Calcium: 9.6 mg/dL (ref 8.7–10.2)
Chloride: 104 mmol/L (ref 96–106)
Creatinine, Ser: 0.82 mg/dL (ref 0.57–1.00)
Glucose: 96 mg/dL (ref 65–99)
Potassium: 3.9 mmol/L (ref 3.5–5.2)
Sodium: 141 mmol/L (ref 134–144)
eGFR: 87 mL/min/{1.73_m2} (ref >59)

## 2020-07-02 LAB — MAGNESIUM: Magnesium: 2.2 mg/dL (ref 1.6–2.3)

## 2020-07-03 ENCOUNTER — Telehealth (HOSPITAL_COMMUNITY): Payer: Self-pay | Admitting: *Deleted

## 2020-07-03 ENCOUNTER — Telehealth (HOSPITAL_COMMUNITY): Payer: Self-pay | Admitting: Emergency Medicine

## 2020-07-03 NOTE — Telephone Encounter (Signed)
Attempted to call patient regarding upcoming cardiac CT appointment. °Left message on voicemail with name and callback number °Maximilian Tallo RN Navigator Cardiac Imaging °[facility] °[phone] Office °[phone] Cell ° °

## 2020-07-03 NOTE — Telephone Encounter (Signed)
Reaching out to patient to offer assistance regarding upcoming cardiac imaging study; pt verbalizes understanding of appt date/time, parking situation and where to check in, pre-test NPO status and medications ordered, and verified current allergies; name and call back number provided for further questions should they arise  Gordy Clement RN Navigator Cardiac Imaging [facility] [phone] office [phone] cell  Pt to take 5mg  corlanor 2 hours prior to cardiac CT.

## 2020-07-08 ENCOUNTER — Ambulatory Visit (HOSPITAL_COMMUNITY)
Admission: RE | Admit: 2020-07-08 | Discharge: 2020-07-08 | Disposition: A | Discharge status: Home / Self Care | Payer: Medicare Other | Source: Ambulatory Visit | Attending: Cardiology | Admitting: Cardiology

## 2020-07-08 ENCOUNTER — Other Ambulatory Visit: Payer: Self-pay

## 2020-07-08 DIAGNOSIS — R002 Palpitations: Secondary | ICD-10-CM

## 2020-07-08 DIAGNOSIS — J438 Other emphysema: Secondary | ICD-10-CM | POA: Insufficient documentation

## 2020-07-08 DIAGNOSIS — I700 Atherosclerosis of aorta: Secondary | ICD-10-CM | POA: Insufficient documentation

## 2020-07-08 DIAGNOSIS — R079 Chest pain, unspecified: Secondary | ICD-10-CM | POA: Insufficient documentation

## 2020-07-08 DIAGNOSIS — K7689 Other specified diseases of liver: Secondary | ICD-10-CM | POA: Insufficient documentation

## 2020-07-08 MED ORDER — NITROGLYCERIN 0.4 MG SL SUBL
SUBLINGUAL_TABLET | SUBLINGUAL | Status: AC
  Filled 2020-07-08: qty 2

## 2020-07-08 MED ORDER — METOPROLOL TARTRATE 5 MG/5ML IV SOLN
INTRAVENOUS | Status: AC
  Administered 2020-07-08: 5 mg via INTRAVENOUS
  Filled 2020-07-08: qty 10

## 2020-07-08 MED ORDER — IOHEXOL 350 MG/ML SOLN
95.0000 mL | Freq: Once | INTRAVENOUS | Status: AC | PRN
  Administered 2020-07-08: 95 mL via INTRAVENOUS

## 2020-07-08 MED ORDER — METOPROLOL TARTRATE 5 MG/5ML IV SOLN: 10.0000 mg | INTRAVENOUS | Status: DC | PRN

## 2020-07-08 MED ORDER — NITROGLYCERIN 0.4 MG SL SUBL
0.8000 mg | SUBLINGUAL_TABLET | Freq: Once | SUBLINGUAL | Status: AC
  Administered 2020-07-08: 0.8 mg via SUBLINGUAL

## 2020-07-08 MED ORDER — DILTIAZEM HCL 25 MG/5ML IV SOLN: 10.0000 mg | INTRAVENOUS | Status: DC | PRN

## 2020-07-10 ENCOUNTER — Other Ambulatory Visit: Payer: Self-pay

## 2020-07-10 DIAGNOSIS — I359 Nonrheumatic aortic valve disorder, unspecified: Secondary | ICD-10-CM

## 2020-07-22 ENCOUNTER — Telehealth: Payer: Self-pay | Admitting: Cardiology

## 2020-07-22 NOTE — Telephone Encounter (Signed)
Kardie Tobb, DO  07/21/2020 11:24 AM EDT      Rare symptomatic PVC. We can monitor for now. I would recommend decrease intakefor caffeine.    Called pt to discuss heart monitor results. No answer at this time.  Left detailed message with results on pt's vm (DPR approved).  Asked pt to call back with any further questions.

## 2020-07-22 NOTE — Telephone Encounter (Signed)
Follow Up:     Patient is returning [facility] call yesterday, concerning her Monitor results.

## 2020-07-31 ENCOUNTER — Other Ambulatory Visit: Payer: Medicare Other

## 2020-08-13 ENCOUNTER — Ambulatory Visit (INDEPENDENT_AMBULATORY_CARE_PROVIDER_SITE_OTHER): Payer: Medicare Other

## 2020-08-13 ENCOUNTER — Other Ambulatory Visit: Payer: Self-pay

## 2020-08-13 DIAGNOSIS — I359 Nonrheumatic aortic valve disorder, unspecified: Secondary | ICD-10-CM | POA: Diagnosis not present

## 2020-08-13 LAB — ECHOCARDIOGRAM COMPLETE
AR max vel: 1.02 cm2
AV Area VTI: 1.08 cm2
AV Area mean vel: 0.97 cm2
AV Mean grad: 9 mmHg
AV Peak grad: 17.5 mmHg
Ao pk vel: 2.09 m/s
Area-P 1/2: 2.9 cm2
P 1/2 time: 556 msec
S' Lateral: 2.2 cm

## 2020-08-15 ENCOUNTER — Telehealth: Payer: Self-pay

## 2020-08-15 NOTE — Telephone Encounter (Signed)
Pt is returning a call in regards to the results

## 2020-08-15 NOTE — Telephone Encounter (Signed)
-----   Message from Berniece Salines, DO sent at 08/13/2020  8:46 PM EDT ----- The echo showed that the heart is not fully relaxing like it should ( diastolic dysfunction) ,but otherwise normal. I will discuss it at the next office visit.

## 2020-08-15 NOTE — Telephone Encounter (Signed)
Called patient. Patient made aware of results. Verbalized understanding. No questions or concerns expressed at this time.

## 2020-09-17 ENCOUNTER — Other Ambulatory Visit: Payer: Self-pay

## 2020-09-17 ENCOUNTER — Ambulatory Visit (INDEPENDENT_AMBULATORY_CARE_PROVIDER_SITE_OTHER): Admitting: Sports Medicine

## 2020-09-17 DIAGNOSIS — G90522 Complex regional pain syndrome I of left lower limb: Secondary | ICD-10-CM

## 2020-09-17 DIAGNOSIS — S99912S Unspecified injury of left ankle, sequela: Secondary | ICD-10-CM

## 2020-09-17 DIAGNOSIS — G5772 Causalgia of left lower limb: Secondary | ICD-10-CM | POA: Diagnosis not present

## 2020-09-17 DIAGNOSIS — M25572 Pain in left ankle and joints of left foot: Secondary | ICD-10-CM | POA: Diagnosis not present

## 2020-09-17 DIAGNOSIS — G8929 Other chronic pain: Secondary | ICD-10-CM

## 2020-09-17 DIAGNOSIS — I739 Peripheral vascular disease, unspecified: Secondary | ICD-10-CM

## 2020-09-17 DIAGNOSIS — R531 Weakness: Secondary | ICD-10-CM

## 2020-09-17 DIAGNOSIS — M79672 Pain in left foot: Secondary | ICD-10-CM

## 2020-09-17 MED ORDER — IBUPROFEN 800 MG PO TABS: 800.0000 mg | ORAL_TABLET | Freq: Three times a day (TID) | ORAL | 0 refills | Status: DC | PRN

## 2020-09-17 NOTE — Patient Instructions (Signed)
Nervive nerve relief supplement for your nerves can be purchased OTC at [facility]  

## 2020-09-17 NOTE — Progress Notes (Signed)
Subjective: Sherri Arnold is a 50 y.o. female patient who returns to office for follow-up evaluation of left ankle pain.  Patient had injury 04/19/2017 work-related and still has pain.  Patient reports that Advil is no longer helping . No other pedal complaints noted at this time.  Patient Active Problem List   Diagnosis Date Noted   A-fib [facility])    Cancer ([place])    COPD (chronic obstructive pulmonary disease) (HCC)    Coronary artery disease    Cough    GERD (gastroesophageal reflux disease)    Memory loss    Muscle pain    Mixed hyperlipidemia    Muscle spasm of back    Nonscarring hair loss, unspecified    Palpitations    Poor circulation    Stroke ([place])    Tarsal tunnel syndrome 03/14/2020   Complex regional pain syndrome type II of left lower limb 02/28/2019   Left lower quadrant pain 02/07/2019   Neck mass 12/18/2018   Referred ear pain, right 12/18/2018   Cigarette smoker 08/15/2018   Hoarse 08/15/2018   Sialadenitis 08/15/2018   Submandibular gland hypertrophy 08/15/2018   Vocal cord polyp 08/15/2018   Bilateral carotid artery stenosis 03/24/2017   Left subclavian artery occlusion 03/24/2017   Occlusion of vertebral artery 03/24/2017   Chest pain 07/27/2016   Ventricular premature beats 07/27/2016    Current Outpatient Medications on File Prior to Visit  Medication Sig Dispense Refill   atorvastatin (LIPITOR) 80 MG tablet Take 80 mg by mouth daily.     cetirizine (ZYRTEC) 10 MG tablet Take 10 mg by mouth daily as needed for allergies.     ivabradine (CORLANOR) 5 MG TABS tablet Take 2 hours before CT scan 1 tablet 0   meloxicam (MOBIC) 7.5 MG tablet Take 7.5 mg by mouth daily.     nitroGLYCERIN (NITROSTAT) 0.4 MG SL tablet Place 1 tablet (0.4 mg total) under the tongue every 5 (five) minutes as needed for chest pain. 90 tablet 3   Vitamin D, Ergocalciferol, (DRISDOL) 1.25 MG (50000 UT) CAPS capsule Take 50,000 Units by mouth every 7 (seven) days.     Current  Facility-Administered Medications on File Prior to Visit  Medication Dose Route Frequency Provider Last Rate Last Admin   ipratropium-albuterol (DUONEB) 0.5-2.5 (3) MG/3ML nebulizer solution 3 mL  3 mL Nebulization Q6H Padgett, Rae Halsted, MD        Allergies  Allergen Reactions   Ciprofloxacin Hcl Swelling   Propofol Other (See Comments)    Severe Hypotension    Wellbutrin [Bupropion] Other (See Comments)    Hallucinations    Objective:  General: Alert and oriented x3 in no acute distress  Dermatology: No open lesions bilateral lower extremities, no webspace macerations, no ecchymosis bilateral, all nails x 10 are well manicured, minimal callus left foot.  Vascular: Dorsalis Pedis and Posterior Tibial pedal pulses faintly palpable, Capillary Fill Time 3 seconds,(+) pedal hair growth bilateral, minimal edema left foot and ankle with varicosities and mild purple hue to the left greater than right foot.  Neurology: Johney Maine sensation intact via light touch bilateral.  Subjective sharp shooting pains over the deep peroneal and superficial peroneal nerve courses of the left foot and ankle to level of now at/above the knee.  Musculoskeletal: Moderate tenderness today to palpation at the sinus tarsi and anterior ankle on the left.  Mild tenderness with palpation at lateral ankle>medial ankle with pain as well along the distal and proximal fibula shaft of the  fibula to the lower third and along the peroneal tendon course and lateral ankle ligaments on left like prior however pain is extending to her calf and posterior thigh.  Thompson sign negative.  Motion guarded on left due to pain.  Strength within normal limits in all groups bilateral except left with guarding due to pain with 4/5 weakness noted.  Gait: Unassisted  Assessment and Plan: Problem List Items Addressed This Visit   None Visit Diagnoses     Complex regional pain syndrome type 1 of left lower extremity    -  Primary    Relevant Medications   ibuprofen (ADVIL) 800 MG tablet   Complex regional pain syndrome type 2 of left lower extremity       Relevant Medications   ibuprofen (ADVIL) 800 MG tablet   Injury of left ankle, sequela       Chronic pain of left ankle       Relevant Medications   ibuprofen (ADVIL) 800 MG tablet   Left foot pain       PVD (peripheral vascular disease) (HCC)       Weakness           -Complete examination performed -Department of labor and duty paperwork completed on behalf of patient again this visit with continue recommendations of no work due to current permanent disability status of which we will complete every 90 days like before -Discussed continued care for her CRPS after injury at work  -Advised patient to consider neuromodulation however at this time patient reports that she does not want to try this -Advised patient to continue with topicals like previous -Refilled motrin -Continue with good supportive shoes daily -Advised patient to continue with use of cane for stability even though patient did not have it with her today like before due to neck and shoulder issues -Return to office for follow-up evaluation in 8-12 weeks or sooner if problems or issues arise.  Landis Martins, DPM

## 2020-09-22 DIAGNOSIS — K589 Irritable bowel syndrome without diarrhea: Secondary | ICD-10-CM | POA: Insufficient documentation

## 2020-09-22 DIAGNOSIS — R42 Dizziness and giddiness: Secondary | ICD-10-CM | POA: Insufficient documentation

## 2020-09-22 DIAGNOSIS — G894 Chronic pain syndrome: Secondary | ICD-10-CM | POA: Insufficient documentation

## 2020-09-24 ENCOUNTER — Encounter: Payer: Self-pay | Admitting: Cardiology

## 2020-09-24 ENCOUNTER — Ambulatory Visit (INDEPENDENT_AMBULATORY_CARE_PROVIDER_SITE_OTHER): Payer: Medicare Other | Admitting: Cardiology

## 2020-09-24 ENCOUNTER — Other Ambulatory Visit: Payer: Self-pay

## 2020-09-24 VITALS — BP 108/73 | HR 80 | Ht 63.0 in | Wt 146.0 lb

## 2020-09-24 DIAGNOSIS — I493 Ventricular premature depolarization: Secondary | ICD-10-CM | POA: Diagnosis not present

## 2020-09-24 DIAGNOSIS — I708 Atherosclerosis of other arteries: Secondary | ICD-10-CM | POA: Diagnosis not present

## 2020-09-24 DIAGNOSIS — I6523 Occlusion and stenosis of bilateral carotid arteries: Secondary | ICD-10-CM | POA: Diagnosis not present

## 2020-09-24 DIAGNOSIS — E782 Mixed hyperlipidemia: Secondary | ICD-10-CM | POA: Diagnosis not present

## 2020-09-24 NOTE — Patient Instructions (Signed)

## 2020-09-24 NOTE — Progress Notes (Signed)
Cardiology Office Note:    Date:  09/24/2020   ID:  Sherri Arnold, DOB 03-Feb-1970, MRN [id number]  PCP:  Reita May, NP  Cardiologist:  Berniece Salines, DO  Electrophysiologist:  None   Referring MD: Reita May, NP   I am doing fine  History of Present Illness:    Sherri Arnold is a 50 y.o. female with a hx of CVA, TIA, hyperlipidemia, smoker, family history of coronary artery disease, bilateral mild carotid artery stenosis, left vertebral artery with suggestion of occlusion on report from bilateral carotid ultrasound 01/31/2016 presented in 02/24/2018 to be evaluated for chest pain and palpitations.    At the end of the visit giving her chest discomfort I sent the patient for coronary CTA, an echocardiogram was also repeated.  She had palpitations and with a history of A. fib we placed a monitor on the patient.  Since her visit she tells me that she has been doing better.  She notes that since she had the evaporating for her coronary CTA she has not had any palpitations.  She denies any chest discomfort or shortness of breath  Past Medical History:  Diagnosis Date   A-fib ([place])    Bilateral carotid artery stenosis 03/24/2017   Cancer (HCC)    Chest pain 07/27/2016   Chronic pain syndrome    Cigarette smoker 08/15/2018   Complex regional pain syndrome type II of left lower limb 02/28/2019   COPD (chronic obstructive pulmonary disease) (HCC)    Coronary artery disease    Cough    Dizziness    GERD (gastroesophageal reflux disease)    IBS (irritable bowel syndrome)    Left lower quadrant pain 02/07/2019   Left subclavian artery occlusion 03/24/2017   Memory loss    Mixed hyperlipidemia    Muscle pain    Muscle spasm of back    Neck mass 12/18/2018   Nonscarring hair loss, unspecified    Occlusion of vertebral artery 03/24/2017   Palpitations    Poor circulation    Referred ear pain, right 12/18/2018   Sialadenitis 08/15/2018   Stroke ([place])    mini-stroke    Submandibular gland hypertrophy 08/15/2018   Tarsal tunnel syndrome 03/14/2020   Ventricular premature beats 07/27/2016   Vocal cord polyp 08/15/2018    Past Surgical History:  Procedure Laterality Date   EYE SURGERY     NASAL SINUS SURGERY      Current Medications: Current Meds  Medication Sig   atorvastatin (LIPITOR) 80 MG tablet Take 80 mg by mouth daily.   Cetirizine-Pseudoephedrine (ZYRTEC-D PO) Take 1 tablet by mouth daily as needed (Congestion and allergies).   Cholecalciferol (D3 ADULT) 25 MCG (1000 UT) CHEW Chew 1 Units by mouth daily.   ibuprofen (ADVIL) 800 MG tablet Take 1 tablet (800 mg total) by mouth every 8 (eight) hours as needed.   meloxicam (MOBIC) 7.5 MG tablet Take 7.5 mg by mouth daily.   nitroGLYCERIN (NITROSTAT) 0.4 MG SL tablet Place 1 tablet (0.4 mg total) under the tongue every 5 (five) minutes as needed for chest pain.   Current Facility-Administered Medications for the 09/24/20 encounter (Office Visit) with Berniece Salines, DO  Medication   ipratropium-albuterol (DUONEB) 0.5-2.5 (3) MG/3ML nebulizer solution 3 mL     Allergies:   Ciprofloxacin hcl, Propofol, and Wellbutrin [bupropion]   Social History   Socioeconomic History   Marital status: Single    Spouse name: Not on file   Number of children: Not  on file   Years of education: Not on file   Highest education level: Not on file  Occupational History   Not on file  Tobacco Use   Smoking status: Every Day    Packs/day: 0.25    Years: 33.00    Pack years: 8.25    Types: Cigarettes   Smokeless tobacco: Never  Vaping Use   Vaping Use: Never used  Substance and Sexual Activity   Alcohol use: No    Comment: Occasional   Drug use: No   Sexual activity: Not on file  Other Topics Concern   Not on file  Social History Narrative   Not on file   Social Determinants of Health   Financial Resource Strain: Not on file  Food Insecurity: Not on file  Transportation Needs: Not on file  Physical  Activity: Not on file  Stress: Not on file  Social Connections: Not on file     Family History: The patient's family history includes COPD in her mother; Diabetes in her maternal grandmother and paternal grandmother; Heart disease in her paternal grandfather.  ROS:   Review of Systems  Constitution: Negative for decreased appetite, fever and weight gain.  HENT: Negative for congestion, ear discharge, hoarse voice and sore throat.   Eyes: Negative for discharge, redness, vision loss in right eye and visual halos.  Cardiovascular: Negative for chest pain, dyspnea on exertion, leg swelling, orthopnea and palpitations.  Respiratory: Negative for cough, hemoptysis, shortness of breath and snoring.   Endocrine: Negative for heat intolerance and polyphagia.  Hematologic/Lymphatic: Negative for bleeding problem. Does not bruise/bleed easily.  Skin: Negative for flushing, nail changes, rash and suspicious lesions.  Musculoskeletal: Negative for arthritis, joint pain, muscle cramps, myalgias, neck pain and stiffness.  Gastrointestinal: Negative for abdominal pain, bowel incontinence, diarrhea and excessive appetite.  Genitourinary: Negative for decreased libido, genital sores and incomplete emptying.  Neurological: Negative for brief paralysis, focal weakness, headaches and loss of balance.  Psychiatric/Behavioral: Negative for altered mental status, depression and suicidal ideas.  Allergic/Immunologic: Negative for HIV exposure and persistent infections.    EKGs/Labs/Other Studies Reviewed:    The following studies were reviewed today:   EKG: None today  08/13/2020 IMPRESSIONS     1. Vertical orientation in the thorax      . Left ventricular ejection fraction, by estimation, is 50 to 55%. The  left ventricle has low normal function. The left ventricle has no regional  wall motion abnormalities. There is mild concentric left ventricular  hypertrophy. Left ventricular  diastolic  parameters are consistent with Grade I diastolic dysfunction  (impaired relaxation).   2. Right ventricular systolic function is mildly reduced. The right  ventricular size is normal. There is normal pulmonary artery systolic  pressure.   3. The mitral valve is normal in structure. No evidence of mitral valve  regurgitation. No evidence of mitral stenosis.   4. The aortic valve is tricuspid. Aortic valve regurgitation is mild.  Mild aortic valve sclerosis is present, with no evidence of aortic valve  stenosis.   5. The inferior vena cava is normal in size with greater than 50%  respiratory variability, suggesting right atrial pressure of 3 mmHg.   FINDINGS   Left Ventricle: Vertical orientation in the thorax.  Left ventricular ejection fraction, by estimation, is 50 to 55%. The left  ventricle has low normal function. The left ventricle has no regional wall  motion abnormalities. Global longitudinal strain performed but not  reported based on interpreter  judgement due to suboptimal tracking. The left ventricular internal cavity  size was normal in size. There is mild concentric left ventricular  hypertrophy. Left ventricular diastolic parameters are consistent with  Grade I diastolic dysfunction (impaired  relaxation).   Right Ventricle: The right ventricular size is normal. No increase in  right ventricular wall thickness. Right ventricular systolic function is  mildly reduced. There is normal pulmonary artery systolic pressure. The  tricuspid regurgitant velocity is 2.12  m/s, and with an assumed right atrial pressure of 3 mmHg, the estimated  right ventricular systolic pressure is [id number] mmHg.   Left Atrium: Left atrial size was normal in size.   Right Atrium: Right atrial size was normal in size.   Pericardium: There is no evidence of pericardial effusion.   Mitral Valve: The mitral valve is normal in structure. No evidence of  mitral valve regurgitation. No evidence of  mitral valve stenosis.   Tricuspid Valve: The tricuspid valve is normal in structure. Tricuspid  valve regurgitation is trivial. No evidence of tricuspid stenosis.   Aortic Valve: The aortic valve is tricuspid. Aortic valve regurgitation is  mild. Aortic regurgitation PHT measures 556 msec. Mild aortic valve  sclerosis is present, with no evidence of aortic valve stenosis. Aortic  valve mean gradient measures 9.0 mmHg.  Aortic valve peak gradient measures 17.5 mmHg. Aortic valve area, by VTI  measures 1.08 cm.   Pulmonic Valve: The pulmonic valve was normal in structure. Pulmonic valve  regurgitation is not visualized. No evidence of pulmonic stenosis.   Aorta: The aortic root, ascending aorta, aortic arch and descending aorta  are all structurally normal, with no evidence of dilitation or  obstruction.   Venous: The pulmonary veins were not well visualized. The inferior vena  cava is normal in size with greater than 50% respiratory variability,  suggesting right atrial pressure of 3 mmHg.   IAS/Shunts: No atrial level shunt detected by color flow Doppler.  07/18/2020 Patch Wear Time:  14 days and 0 hours starting 06/24/2020 Indications: Palpitations   Patient had a minimum HR of 55 bpm, maximum HR of 141 bpm, and average HR of 85 bpm.  Predominant underlying rhythm was Sinus Rhythm.    Premature atrial complexes were rare (<1.0%). Premature ventricular complexes were rare (<1.0%). Ventricular Bigeminy and Trigeminy were present.   Symptoms associated with premature atrial complexes and premature ventricular complexes.    No ventricular tachycardia, no supraventricular tachycardia, no pauses and no atrial fibrillation.   Conclusion- Unremarkable study with no significant arrhythmia, noted symptomatic rare PAC and PVC.  Coronary CTA done July 08, 2020 Aorta: Mild dilatation (37 mm) at the proximal ascending aorta. No calcifications. No dissection.   Aortic Valve: Trileaflet  with mild thickening. Noted calcifications (Agatson score 138).   Coronary Arteries:  Normal coronary origin.  Co-dominance.  RCA is a large co-dominant artery that gives rise to PDA and PLA. There is no plaque.   Left main is a large artery that gives rise to LAD and LCX arteries.   LAD is a large vessel that has no plaque.   LCX is a co-dominant artery that gives rise to one large OM1 branch. There is no plaque.   Other findings:   Normal pulmonary vein drainage into the left atrium.   Normal left atrial appendage without a thrombus.   Normal size of the pulmonary artery.   IMPRESSION: 1. Coronary calcium score of 0. This was 0 percentile for age and sex matched control.  2. Normal coronary origin with Co-dominance.   3. No evidence of CAD. CAD-RADS 0. No evidence of CAD (0%). Consider non-atherosclerotic causes of chest pain.   4. Calcification of the Aortic valve - Agatson score 138.      Bilateral carotid ultrasound June 2018.  Blood flow within the right internal carotid artery are consistent with 1-39% stenosis.  Doppler velocities in the left internal carotid artery are consistent with 1-39 stenosis.  No color-flow spectral Doppler signals were obtainable in the left vertebral artery suggestive of occlusion.   Recent Labs: 07/01/2020: BUN 20; Creatinine, Ser 0.82; Magnesium 2.2; Potassium 3.9; Sodium 141  Recent Lipid Panel No results found for: CHOL, TRIG, HDL, CHOLHDL, VLDL, LDLCALC, LDLDIRECT  Physical Exam:    VS:  BP 108/73 (BP Location: Left Arm, Patient Position: Sitting, Cuff Size: Normal)   Pulse 80   Ht '5\' 3"'$  (1.6 m)   Wt 146 lb (66.2 kg)   SpO2 98%   BMI 25.86 kg/m     Wt Readings from Last 3 Encounters:  09/24/20 146 lb (66.2 kg)  06/24/20 146 lb 9.6 oz (66.5 kg)  06/11/20 150 lb (68 kg)     GEN: Well nourished, well developed in no acute distress HEENT: Normal NECK: No JVD; No carotid bruits LYMPHATICS: No lymphadenopathy CARDIAC: S1S2  noted,RRR, no murmurs, rubs, gallops RESPIRATORY:  Clear to auscultation without rales, wheezing or rhonchi  ABDOMEN: Soft, non-tender, non-distended, +bowel sounds, no guarding. EXTREMITIES: No edema, No cyanosis, no clubbing MUSCULOSKELETAL:  No deformity  SKIN: Warm and dry NEUROLOGIC:  Alert and oriented x 3, non-focal PSYCHIATRIC:  Normal affect, good insight  ASSESSMENT:    1. Ventricular premature beats   2. Bilateral carotid artery stenosis   3. Left subclavian artery occlusion   4. Mixed hyperlipidemia    PLAN:     I reviewed her test results with her in full details.  I explained to the patient about the echocardiogram results below normal EF.  Also with a mildly depressed ejection fraction.  Her coronary CTA result with 0 calcium with no evidence of coronary artery disease were shared with the patient however I educated her about the findings for pulmonary emphysema and her benign hepatic cyst.  The report has also been faxed to the patient PCP.  She is a smoker we talked about smoking cessation.  She plans to quit she does not have a quit date yet but tells me this is difficult.  She had been diagnosed with sleep apnea in the past she was unable to get the CPAP due to co-pay issues but now she will discuss again with her PCP to see if she can get this process started again.  She had seen vascular and neurology in the past for her bilateral carotid artery stenosis and her vertebral artery disease.  Continue the patient on her current medication regimen.  The patient is in agreement with the above plan. The patient left the office in stable condition.  The patient will follow up as needed.   Medication Adjustments/Labs and Tests Ordered: Current medicines are reviewed at length with the patient today.  Concerns regarding medicines are outlined above.  No orders of the defined types were placed in this encounter.  No orders of the defined types were placed in this  encounter.   Patient Instructions  Medication Instructions:  Your physician recommends that you continue on your current medications as directed. Please refer to the Current Medication list given to you today.  *  If you need a refill on your cardiac medications before your next appointment, please call your pharmacy*   Lab Work: None If you have labs (blood work) drawn today and your tests are completely normal, you will receive your results only by: [place] (if you have MyChart) OR A paper copy in the mail If you have any lab test that is abnormal or we need to change your treatment, we will call you to review the results.   Testing/Procedures: None   Follow-Up: At [facility], you and your health needs are our priority.  As part of our continuing mission to provide you with exceptional heart care, we have created designated Provider Care Teams.  These Care Teams include your primary Cardiologist (physician) and Advanced Practice Providers (APPs -  Physician Assistants and Nurse Practitioners) who all work together to provide you with the care you need, when you need it.  We recommend signing up for the patient portal called "MyChart".  Sign up information is provided on this After Visit Summary.  MyChart is used to connect with patients for Virtual Visits (Telemedicine).  Patients are able to view lab/test results, encounter notes, upcoming appointments, etc.  Non-urgent messages can be sent to your provider as well.   To learn more about what you can do with MyChart, go to [email or web address].    Your next appointment:   As needed  The format for your next appointment:   In Person  Provider:   Berniece Salines, DO   Other Instructions    Adopting a Healthy Lifestyle.  Know what a healthy weight is for you (roughly BMI <25) and aim to maintain this   Aim for 7+ servings of fruits and vegetables daily   65-80+ fluid ounces of water or unsweet tea for healthy  kidneys   Limit to max 1 drink of alcohol per day; avoid smoking/tobacco   Limit animal fats in diet for cholesterol and heart health - choose grass fed whenever available   Avoid highly processed foods, and foods high in saturated/trans fats   Aim for low stress - take time to unwind and care for your mental health   Aim for 150 min of moderate intensity exercise weekly for heart health, and weights twice weekly for bone health   Aim for 7-9 hours of sleep daily   When it comes to diets, agreement about the perfect plan isnt easy to find, even among the experts. Experts at the [employer] developed an idea known as the Healthy Eating Plate. Just imagine a plate divided into logical, healthy portions.   The emphasis is on diet quality:   Load up on vegetables and fruits - one-half of your plate: Aim for color and variety, and remember that potatoes dont count.   Go for whole grains - one-quarter of your plate: Whole wheat, barley, wheat berries, quinoa, oats, brown rice, and foods made with them. If you want pasta, go with whole wheat pasta.   Protein power - one-quarter of your plate: Fish, chicken, beans, and nuts are all healthy, versatile protein sources. Limit red meat.   The diet, however, does go beyond the plate, offering a few other suggestions.   Use healthy plant oils, such as olive, canola, soy, corn, sunflower and peanut. Check the labels, and avoid partially hydrogenated oil, which have unhealthy trans fats.   If youre thirsty, drink water. Coffee and tea are good in moderation, but skip sugary drinks and limit milk and  dairy products to one or two daily servings.   The type of carbohydrate in the diet is more important than the amount. Some sources of carbohydrates, such as vegetables, fruits, whole grains, and beans-are healthier than others.   Finally, stay active  Signed, Berniece Salines, DO  09/24/2020 2:31 PM    [facility]

## 2020-10-31 ENCOUNTER — Institutional Professional Consult (permissible substitution): Payer: Medicare Other | Admitting: Pulmonary Disease

## 2020-11-12 ENCOUNTER — Institutional Professional Consult (permissible substitution): Payer: Medicare Other | Admitting: Pulmonary Disease

## 2020-12-08 ENCOUNTER — Institutional Professional Consult (permissible substitution): Payer: Medicare Other | Admitting: Pulmonary Disease

## 2020-12-10 ENCOUNTER — Ambulatory Visit (INDEPENDENT_AMBULATORY_CARE_PROVIDER_SITE_OTHER): Payer: Medicare HMO | Admitting: Pulmonary Disease

## 2020-12-10 ENCOUNTER — Encounter: Payer: Self-pay | Admitting: Pulmonary Disease

## 2020-12-10 ENCOUNTER — Other Ambulatory Visit: Payer: Self-pay

## 2020-12-10 VITALS — BP 82/70 | HR 87 | Ht 63.0 in | Wt 142.4 lb

## 2020-12-10 DIAGNOSIS — G4733 Obstructive sleep apnea (adult) (pediatric): Secondary | ICD-10-CM

## 2020-12-10 DIAGNOSIS — F17210 Nicotine dependence, cigarettes, uncomplicated: Secondary | ICD-10-CM

## 2020-12-10 DIAGNOSIS — J449 Chronic obstructive pulmonary disease, unspecified: Secondary | ICD-10-CM

## 2020-12-10 NOTE — Assessment & Plan Note (Signed)
Reviewed CT images showing emphysema with the patient to motivate her to try to quit smoking Schedule pFTs -previous spirometry has shown reversible obstruction  Resume Breztri 2 puffs twice daily , rinse mouth after use Use albuterol MDI as needed for immediate relief  She may benefit from pulmonary rehab program but seems to be limited by chronic left lower extremity pain

## 2020-12-10 NOTE — Patient Instructions (Signed)
  X Home sleep study  X Schedule pFTs  Resume Breztri 2 puffs twice daily , rinse mouth after use Use albuterol MDI as needed for immediate relief  You have to QUIT smoking X QUIT line

## 2020-12-10 NOTE — Assessment & Plan Note (Signed)
Given excessive daytime somnolence, narrow pharyngeal exam, witnessed apneas & loud snoring, obstructive sleep apnea is very likely & an overnight polysomnogram will be scheduled as a home study. The pathophysiology of obstructive sleep apnea , it's cardiovascular consequences & modes of treatment including CPAP were discused with the patient in detail & they evidenced understanding.   Pretest probability is intermediate.  She does report a previous study in [place] which showed sleep apnea

## 2020-12-10 NOTE — Progress Notes (Signed)
Subjective:    Patient ID: Sherri Arnold, female    DOB: [birth year], 50 y.o.   MRN: [id number]  HPI  50 year old presents for evaluation of emphysema and obstructive sleep apnea  She smoked about half pack per day starting as a teenager more than 30 pack years.  She reports lower respiratory infection in October which she is convinced was RSV, she reports yellow sputum production.  Albuterol provides her relief.  She was given samples of Breztri from her PCP but she seldom uses this. She reports dyspnea on exertion, denies wheezing She underwent evaluation by cardiology which included coronary CT that showed emphysema hence was referred to [place] for further evaluation   Her boyfriend has noted that she stops breathing in her sleep.  Loud snoring has been noted by family members and she has occasionally woken herself up by her snoring.  Epworth sleepiness score is 6 also reports sleepiness while sitting and reading, or watching TV. Bedtime is between 9 and 10 PM, sleep latency about 30 minutes, she sleeps on her back with 1 pillow due to neck issues, wakes up every 1-2 hours due to nocturia or spontaneously and is out of bed latest by 6 AM feeling tired without dryness of mouth or headaches. She has gained 15 pounds over the last 2 years. She had a home sleep study done in [place] about 6 years ago and was told that she had OSA but was unable to obtain a CPAP due to insurance issues  Blood pressure is noted to be low today but she reports that she has had readings like this before, blood pressure on last 2 cardiology visits was 98 systolic and 903 systolic  PMH - CVA-no residuals, hyperlipidemia,  -left vertebral artery with suggestion of occlusion  -Chronic left lower extremity pain   Significant tests/ events reviewed  06/2020 CT coronaries -calcium score of 0, moderate bilateral emphysema  01/2017 spirometry -moderate reversible airway obstruction, ratio 55, FEV1 1.34/48%, improved  to 1.69/61% , 20% bronchodilator response, FVC 76%   Past Medical History:  Diagnosis Date   A-fib ([place])    Bilateral carotid artery stenosis 03/24/2017   Cancer ([place])    Chest pain 07/27/2016   Chronic pain syndrome    Cigarette smoker 08/15/2018   Complex regional pain syndrome type II of left lower limb 02/28/2019   COPD (chronic obstructive pulmonary disease) (HCC)    Coronary artery disease    Cough    Dizziness    GERD (gastroesophageal reflux disease)    IBS (irritable bowel syndrome)    Left lower quadrant pain 02/07/2019   Left subclavian artery occlusion 03/24/2017   Memory loss    Mixed hyperlipidemia    Muscle pain    Muscle spasm of back    Neck mass 12/18/2018   Nonscarring hair loss, unspecified    Occlusion of vertebral artery 03/24/2017   Palpitations    Poor circulation    Referred ear pain, right 12/18/2018   Sialadenitis 08/15/2018   Stroke ([place])    mini-stroke   Submandibular gland hypertrophy 08/15/2018   Tarsal tunnel syndrome 03/14/2020   Ventricular premature beats 07/27/2016   Vocal cord polyp 08/15/2018   Past Surgical History:  Procedure Laterality Date   EYE SURGERY     NASAL SINUS SURGERY      Allergies  Allergen Reactions   Ciprofloxacin Hcl Swelling   Propofol Other (See Comments)    Severe Hypotension    Wellbutrin [Bupropion] Other (See Comments)  Hallucinations    Social History   Socioeconomic History   Marital status: Single    Spouse name: Not on file   Number of children: Not on file   Years of education: Not on file   Highest education level: Not on file  Occupational History   Not on file  Tobacco Use   Smoking status: Every Day    Packs/day: 0.25    Years: 33.00    Pack years: 8.25    Types: Cigarettes   Smokeless tobacco: Never  Vaping Use   Vaping Use: Never used  Substance and Sexual Activity   Alcohol use: No    Comment: Occasional   Drug use: No   Sexual activity: Not on file  Other Topics Concern   Not on  file  Social History Narrative   Not on file   Social Determinants of Health   Financial Resource Strain: Not on file  Food Insecurity: Not on file  Transportation Needs: Not on file  Physical Activity: Not on file  Stress: Not on file  Social Connections: Not on file  Intimate Partner Violence: Not on file     Family History  Problem Relation Age of Onset   Heart disease Paternal Grandfather    COPD Mother    Diabetes Maternal Grandmother    Diabetes Paternal Grandmother      Review of Systems  Shortness of breath with activity Nasal congestion Acid heartburn  Constitutional: negative for anorexia, fevers and sweats  Eyes: negative for irritation, redness and visual disturbance  Ears, nose, mouth, throat, and face: negative for earaches, epistaxis, nasal congestion and sore throat  Cardiovascular: negative for chest pain,  lower extremity edema, orthopnea, palpitations and syncope  Gastrointestinal: negative for abdominal pain, constipation, diarrhea, melena, nausea and vomiting  Genitourinary:negative for dysuria, frequency and hematuria  Hematologic/lymphatic: negative for bleeding, easy bruising and lymphadenopathy  Musculoskeletal:negative for arthralgias, muscle weakness and stiff joints  Neurological: negative for coordination problems, gait problems, headaches and weakness  Endocrine: negative for diabetic symptoms including polydipsia, polyuria and weight loss     Objective:   Physical Exam  Gen. Pleasant, well-nourished, in no distress, normal affect ENT - no pallor,icterus, no post nasal drip Neck: No JVD, no thyromegaly, no carotid bruits Lungs: no use of accessory muscles, no dullness to percussion, clear without rales or rhonchi  Cardiovascular: Rhythm regular, heart sounds  normal, no murmurs or gallops, no peripheral edema Abdomen: soft and non-tender, no hepatosplenomegaly, BS normal. Musculoskeletal: No deformities, no cyanosis or clubbing Neuro:   alert, non focal       Assessment & Plan:

## 2020-12-10 NOTE — Assessment & Plan Note (Signed)
Smoking cessation was emphasized to her is the most important intervention.  She was not willing to commit to a quit attempt.  We will refer her to the smoking cessation quit line

## 2020-12-17 ENCOUNTER — Ambulatory Visit (INDEPENDENT_AMBULATORY_CARE_PROVIDER_SITE_OTHER): Admitting: Sports Medicine

## 2020-12-17 ENCOUNTER — Encounter: Payer: Self-pay | Admitting: Sports Medicine

## 2020-12-17 DIAGNOSIS — G5772 Causalgia of left lower limb: Secondary | ICD-10-CM

## 2020-12-17 DIAGNOSIS — G8929 Other chronic pain: Secondary | ICD-10-CM

## 2020-12-17 DIAGNOSIS — M25572 Pain in left ankle and joints of left foot: Secondary | ICD-10-CM | POA: Diagnosis not present

## 2020-12-17 DIAGNOSIS — S99912S Unspecified injury of left ankle, sequela: Secondary | ICD-10-CM

## 2020-12-17 DIAGNOSIS — G90522 Complex regional pain syndrome I of left lower limb: Secondary | ICD-10-CM

## 2020-12-17 DIAGNOSIS — M79672 Pain in left foot: Secondary | ICD-10-CM

## 2020-12-17 DIAGNOSIS — I739 Peripheral vascular disease, unspecified: Secondary | ICD-10-CM

## 2020-12-17 MED ORDER — MELOXICAM 7.5 MG PO TABS: 7.5000 mg | ORAL_TABLET | Freq: Every day | ORAL | 1 refills | Status: DC

## 2020-12-17 NOTE — Progress Notes (Signed)
Subjective: Sherri Arnold is a 50 y.o. female patient who returns to office for follow-up evaluation of left ankle pain.  Patient had injury 04/19/2017 work-related and still has pain.  Patient reports that pain is the same and now that it is wintertime it is worse and makes it harder for her to get out of bed states that the pain is worse mid shin all the way down to the foot and feels like the pain is slowly progressing.  Patient has tried all treatment options and still has pain and states that she also has pain in her right knee as well as a torn rotator cuff and issues with the nerves in her neck.  Patient denies any other pedal complaints at this time.  Patient Active Problem List   Diagnosis Date Noted   OSA (obstructive sleep apnea) 12/10/2020   Chronic pain syndrome 09/22/2020   Dizziness 09/22/2020   IBS (irritable bowel syndrome) 09/22/2020   Cancer (HCC)    COPD (chronic obstructive pulmonary disease) (HCC)    Cough    GERD (gastroesophageal reflux disease)    Memory loss    Muscle pain    Mixed hyperlipidemia    Muscle spasm of back    Nonscarring hair loss, unspecified    Palpitations    Poor circulation    Stroke ([place])    Tarsal tunnel syndrome 03/14/2020   Complex regional pain syndrome type II of left lower limb 02/28/2019   Left lower quadrant pain 02/07/2019   Neck mass 12/18/2018   Referred ear pain, right 12/18/2018   Cigarette smoker 08/15/2018   Hoarse 08/15/2018   Sialadenitis 08/15/2018   Submandibular gland hypertrophy 08/15/2018   Vocal cord polyp 08/15/2018   Bilateral carotid artery stenosis 03/24/2017   Left subclavian artery occlusion 03/24/2017   Occlusion of vertebral artery 03/24/2017   Chest pain 07/27/2016   Ventricular premature beats 07/27/2016    Current Outpatient Medications on File Prior to Visit  Medication Sig Dispense Refill   atorvastatin (LIPITOR) 80 MG tablet Take 80 mg by mouth daily.     Budeson-Glycopyrrol-Formoterol  (BREZTRI AEROSPHERE) 160-9-4.8 MCG/ACT AERO Inhale 2 puffs into the lungs daily.     Cetirizine-Pseudoephedrine (ZYRTEC-D PO) Take 1 tablet by mouth daily as needed (Congestion and allergies).     Cholecalciferol (D3 ADULT) 25 MCG (1000 UT) CHEW Chew 1 Units by mouth daily.     ibuprofen (ADVIL) 800 MG tablet Take 1 tablet (800 mg total) by mouth every 8 (eight) hours as needed. 30 tablet 0   nitroGLYCERIN (NITROSTAT) 0.4 MG SL tablet Place 1 tablet (0.4 mg total) under the tongue every 5 (five) minutes as needed for chest pain. 90 tablet 3   VENTOLIN HFA 108 (90 Base) MCG/ACT inhaler SMARTSIG:1-2 Puff(s) By Mouth Every 6 Hours     Current Facility-Administered Medications on File Prior to Visit  Medication Dose Route Frequency Provider Last Rate Last Admin   ipratropium-albuterol (DUONEB) 0.5-2.5 (3) MG/3ML nebulizer solution 3 mL  3 mL Nebulization Q6H Padgett, Rae Halsted, MD        Allergies  Allergen Reactions   Ciprofloxacin Hcl Swelling   Propofol Other (See Comments)    Severe Hypotension    Wellbutrin [Bupropion] Other (See Comments)    Hallucinations    Objective:  General: Alert and oriented x3 in no acute distress  Dermatology: No open lesions bilateral lower extremities, no webspace macerations, no ecchymosis bilateral, all nails x 10 are well manicured, minimal callus left foot.  Vascular: Dorsalis Pedis and Posterior Tibial pedal pulses faintly palpable, Capillary Fill Time 3 seconds,(+) pedal hair growth bilateral, minimal edema left foot and ankle with varicosities and mild purple hue to the left greater than right foot like previous.  Neurology: Johney Maine sensation intact via light touch bilateral.  Subjective sharp shooting pains over the deep peroneal and superficial peroneal nerve courses of the left foot and ankle to level of now at/above the knee like previous.  Musculoskeletal: Moderate tenderness today to palpation at the medial and lateral edges of the  Achilles tendon and posterior calf no warmth or redness no signs of DVT, there is pain to palpation at the sinus tarsi and anterior ankle on the left.  Mild to moderate tenderness with palpation at lateral ankle>medial ankle with pain as well along the distal and proximal fibula shaft of the fibula to the lower third and along the peroneal tendon course and lateral ankle ligaments on left like prior however pain is extending to her calf and posterior thigh.  Thompson sign negative as mentioned above.  Motion guarded on left due to pain.  Strength within normal limits in all groups bilateral except left with guarding due to pain with 4/5 weakness noted.  Gait: Unassisted  Assessment and Plan: Problem List Items Addressed This Visit   None Visit Diagnoses     Complex regional pain syndrome type 1 of left lower extremity    -  Primary   Relevant Medications   meloxicam (MOBIC) 7.5 MG tablet   Complex regional pain syndrome type 2 of left lower extremity       Relevant Medications   meloxicam (MOBIC) 7.5 MG tablet   Injury of left ankle, sequela       Chronic pain of left ankle       Relevant Medications   meloxicam (MOBIC) 7.5 MG tablet   Left foot pain       PVD (peripheral vascular disease) (HCC)           -Complete examination performed -Department of labor and duty paperwork completed on behalf of patient again this visit with continue recommendations of no work due to current permanent disability status.  I do not foresee patient getting any better since this issue has been going on for almost 4 years with patient failing to improve -Discussed continued care for her CRPS after injury at work  -Rx Mobic -Continues with topicals as tolerated for pain control -Continue with good supportive shoes daily -Advised patient to continue with use of cane for stability however patient still does not have a cane with her because she reports that it makes it difficult for her to use because of her  rotator cuff and neck cannot use it safely on the right side -Return to office for follow-up evaluation in 8-12 weeks or sooner if problems or issues arise.  Landis Martins, DPM

## 2021-02-11 ENCOUNTER — Telehealth: Payer: Self-pay | Admitting: Pulmonary Disease

## 2021-02-11 NOTE — Telephone Encounter (Signed)
This is an FYI to the provider.

## 2021-03-10 ENCOUNTER — Ambulatory Visit: Payer: Medicare HMO | Admitting: Adult Health

## 2021-03-18 ENCOUNTER — Encounter: Payer: Self-pay | Admitting: Sports Medicine

## 2021-03-18 ENCOUNTER — Ambulatory Visit (INDEPENDENT_AMBULATORY_CARE_PROVIDER_SITE_OTHER): Admitting: Sports Medicine

## 2021-03-18 ENCOUNTER — Other Ambulatory Visit: Payer: Self-pay

## 2021-03-18 DIAGNOSIS — M5432 Sciatica, left side: Secondary | ICD-10-CM

## 2021-03-18 DIAGNOSIS — G5772 Causalgia of left lower limb: Secondary | ICD-10-CM | POA: Diagnosis not present

## 2021-03-18 DIAGNOSIS — S99912S Unspecified injury of left ankle, sequela: Secondary | ICD-10-CM

## 2021-03-18 DIAGNOSIS — G90522 Complex regional pain syndrome I of left lower limb: Secondary | ICD-10-CM

## 2021-03-18 DIAGNOSIS — M25572 Pain in left ankle and joints of left foot: Secondary | ICD-10-CM

## 2021-03-18 DIAGNOSIS — M5431 Sciatica, right side: Secondary | ICD-10-CM

## 2021-03-18 DIAGNOSIS — M79672 Pain in left foot: Secondary | ICD-10-CM

## 2021-03-18 DIAGNOSIS — G8929 Other chronic pain: Secondary | ICD-10-CM

## 2021-03-18 DIAGNOSIS — M79671 Pain in right foot: Secondary | ICD-10-CM

## 2021-03-18 MED ORDER — PREDNISONE 10 MG (21) PO TBPK: ORAL_TABLET | ORAL | 0 refills | Status: DC

## 2021-03-18 MED ORDER — MELOXICAM 15 MG PO TABS: 15.0000 mg | ORAL_TABLET | Freq: Every day | ORAL | 0 refills | Status: DC

## 2021-03-18 NOTE — Progress Notes (Signed)
Subjective: ?Sherri Arnold is a 51 y.o. female patient who returns to office for follow-up evaluation of left ankle pain.  Patient had injury 04/19/2017 work-related and still has pain and states now that she is getting pain that is radiating down from the hip to the foot on the left as well as some pain that is very similar on the right.  Patient denies any other pedal complaints at this time. ? ?Patient Active Problem List  ? Diagnosis Date Noted  ? OSA (obstructive sleep apnea) 12/10/2020  ? Chronic pain syndrome 09/22/2020  ? Dizziness 09/22/2020  ? IBS (irritable bowel syndrome) 09/22/2020  ? Cancer [facility])   ? COPD (chronic obstructive pulmonary disease) ([place])   ? Cough   ? GERD (gastroesophageal reflux disease)   ? Memory loss   ? Muscle pain   ? Mixed hyperlipidemia   ? Muscle spasm of back   ? Nonscarring hair loss, unspecified   ? Palpitations   ? Poor circulation   ? Stroke [facility])   ? Tarsal tunnel syndrome 03/14/2020  ? Complex regional pain syndrome type II of left lower limb 02/28/2019  ? Left lower quadrant pain 02/07/2019  ? Neck mass 12/18/2018  ? Referred ear pain, right 12/18/2018  ? Cigarette smoker 08/15/2018  ? Hoarse 08/15/2018  ? Sialadenitis 08/15/2018  ? Submandibular gland hypertrophy 08/15/2018  ? Vocal cord polyp 08/15/2018  ? Bilateral carotid artery stenosis 03/24/2017  ? Left subclavian artery occlusion 03/24/2017  ? Occlusion of vertebral artery 03/24/2017  ? Chest pain 07/27/2016  ? Ventricular premature beats 07/27/2016  ? ? ?Current Outpatient Medications on File Prior to Visit  ?Medication Sig Dispense Refill  ? atorvastatin (LIPITOR) 80 MG tablet Take 80 mg by mouth daily.    ? benzonatate (TESSALON) 100 MG capsule Take 100 mg by mouth 3 (three) times daily as needed.    ? Budeson-Glycopyrrol-Formoterol (BREZTRI AEROSPHERE) 160-9-4.8 MCG/ACT AERO Inhale 2 puffs into the lungs daily.    ? Cetirizine-Pseudoephedrine (ZYRTEC-D PO) Take 1 tablet by mouth daily as needed (Congestion  and allergies).    ? Cholecalciferol (D3 ADULT) 25 MCG (1000 UT) CHEW Chew 1 Units by mouth daily.    ? nitroGLYCERIN (NITROSTAT) 0.4 MG SL tablet Place 1 tablet (0.4 mg total) under the tongue every 5 (five) minutes as needed for chest pain. 90 tablet 3  ? VENTOLIN HFA 108 (90 Base) MCG/ACT inhaler SMARTSIG:1-2 Puff(s) By Mouth Every 6 Hours    ? ?Current Facility-Administered Medications on File Prior to Visit  ?Medication Dose Route Frequency Provider Last Rate Last Admin  ? ipratropium-albuterol (DUONEB) 0.5-2.5 (3) MG/3ML nebulizer solution 3 mL  3 mL Nebulization Q6H Padgett, Rae Halsted, MD      ? ? ?Allergies  ?Allergen Reactions  ? Ciprofloxacin Hcl Swelling  ? Propofol Other (See Comments)  ?  Severe Hypotension   ? Wellbutrin [Bupropion] Other (See Comments)  ?  Hallucinations  ? ? ?Objective:  ?General: Alert and oriented x3 in no acute distress ? ?Dermatology: No open lesions bilateral lower extremities, no webspace macerations, no ecchymosis bilateral, all nails x 10 are well manicured, minimal callus left foot as before. ? ?Vascular: Dorsalis Pedis and Posterior Tibial pedal pulses faintly palpable, Capillary Fill Time 3 seconds,(+) pedal hair growth bilateral, minimal edema left foot and ankle with varicosities and mild purple hue to the left greater than right foot like previous. ? ?Neurology: Gross sensation intact via light touch bilateral.  Subjective sharp shooting pains over the  deep peroneal and superficial peroneal nerve courses of the left foot and ankle to level of now at/above the knee and at the hip likely consistent with sciatica on both left and right ? ?Musculoskeletal: Unchanged diffuse tenderness to the left foot and ankle.  There is no reproducible pain to palpation to right foot and ankle however subjectively patient reports that there is pain at the lateral side of the right foot and ankle that radiates to the hip.  Strength within normal limits in all groups bilateral except  left with guarding due to pain with 4/5 weakness noted. ? ?Gait: Unassisted ? ?Assessment and Plan: ?Problem List Items Addressed This Visit   ?None ?Visit Diagnoses   ? ? Complex regional pain syndrome type 1 of left lower extremity    -  Primary  ? Relevant Medications  ? predniSONE (STERAPRED UNI-PAK 21 TAB) 10 MG (21) TBPK tablet  ? meloxicam (MOBIC) 15 MG tablet  ? Complex regional pain syndrome type 2 of left lower extremity      ? Relevant Medications  ? predniSONE (STERAPRED UNI-PAK 21 TAB) 10 MG (21) TBPK tablet  ? meloxicam (MOBIC) 15 MG tablet  ? Injury of left ankle, sequela      ? Chronic pain of left ankle      ? Relevant Medications  ? predniSONE (STERAPRED UNI-PAK 21 TAB) 10 MG (21) TBPK tablet  ? meloxicam (MOBIC) 15 MG tablet  ? Left foot pain      ? Right foot pain      ? Bilateral sciatica      ? ?  ?  ?-Complete examination performed ?-Discussed with patient her long-term outcome of chronically having pain at this time there is nothing more that I can offer for her symptoms however for her acute concern of new sciatica I do recommend for patient to follow-up with PCP and to also discuss with PCP additional testing for arthralgia or fibromyalgia ?-Rx prednisone Dosepak and then once she completes that to resume Mobic 15 mg daily ?-Department of Labor paperwork completed for patient with no work due to chronic pain condition. ?Return to office for one additional check up in 2 months or sooner if issues arise. ? ?Landis Martins, DPM ? ?

## 2021-05-20 ENCOUNTER — Ambulatory Visit (INDEPENDENT_AMBULATORY_CARE_PROVIDER_SITE_OTHER): Admitting: Sports Medicine

## 2021-05-20 ENCOUNTER — Encounter: Payer: Self-pay | Admitting: Sports Medicine

## 2021-05-20 DIAGNOSIS — S99912S Unspecified injury of left ankle, sequela: Secondary | ICD-10-CM

## 2021-05-20 DIAGNOSIS — G90522 Complex regional pain syndrome I of left lower limb: Secondary | ICD-10-CM | POA: Diagnosis not present

## 2021-05-20 DIAGNOSIS — M79672 Pain in left foot: Secondary | ICD-10-CM

## 2021-05-20 DIAGNOSIS — G8929 Other chronic pain: Secondary | ICD-10-CM

## 2021-05-20 DIAGNOSIS — M25572 Pain in left ankle and joints of left foot: Secondary | ICD-10-CM

## 2021-05-20 DIAGNOSIS — G5772 Causalgia of left lower limb: Secondary | ICD-10-CM | POA: Diagnosis not present

## 2021-05-20 MED ORDER — IBUPROFEN 800 MG PO TABS: 800.0000 mg | ORAL_TABLET | Freq: Three times a day (TID) | ORAL | 0 refills | Status: AC | PRN

## 2021-05-20 MED ORDER — DICLOFENAC EPOLAMINE 1.3 % EX PTCH: 1.0000 | MEDICATED_PATCH | Freq: Every day | CUTANEOUS | 1 refills | Status: AC

## 2021-05-20 NOTE — Progress Notes (Signed)
Subjective: ?Sherri Arnold is a 51 y.o. female patient who returns to office for follow-up evaluation of left ankle pain.  Patient had injury 04/19/2017 work-related and still has pain and is currently being managed for chronic regional pain syndrome.  Patient reports that she has had episodes of horrible pain in her left ankle with swelling and cold sensation states when she saw her primary doctor they even sent her for an ultrasound which was negative states that it hurts with shoes she feels better when the shoes are open and do not rub and things still feel very numb tingle and hurt.  Patient denies any other pedal complaints at this time. ? ?Patient Active Problem List  ? Diagnosis Date Noted  ? OSA (obstructive sleep apnea) 12/10/2020  ? Chronic pain syndrome 09/22/2020  ? Dizziness 09/22/2020  ? IBS (irritable bowel syndrome) 09/22/2020  ? Cancer [facility])   ? COPD (chronic obstructive pulmonary disease) ([place])   ? Cough   ? GERD (gastroesophageal reflux disease)   ? Memory loss   ? Muscle pain   ? Mixed hyperlipidemia   ? Muscle spasm of back   ? Nonscarring hair loss, unspecified   ? Palpitations   ? Poor circulation   ? Stroke [facility])   ? Tarsal tunnel syndrome 03/14/2020  ? Complex regional pain syndrome type II of left lower limb 02/28/2019  ? Left lower quadrant pain 02/07/2019  ? Neck mass 12/18/2018  ? Referred ear pain, right 12/18/2018  ? Cigarette smoker 08/15/2018  ? Hoarse 08/15/2018  ? Sialadenitis 08/15/2018  ? Submandibular gland hypertrophy 08/15/2018  ? Vocal cord polyp 08/15/2018  ? Bilateral carotid artery stenosis 03/24/2017  ? Left subclavian artery occlusion 03/24/2017  ? Occlusion of vertebral artery 03/24/2017  ? Chest pain 07/27/2016  ? Ventricular premature beats 07/27/2016  ? ? ?Current Outpatient Medications on File Prior to Visit  ?Medication Sig Dispense Refill  ? atorvastatin (LIPITOR) 80 MG tablet Take 80 mg by mouth daily.    ? Budeson-Glycopyrrol-Formoterol (BREZTRI AEROSPHERE)  160-9-4.8 MCG/ACT AERO Inhale 2 puffs into the lungs daily.    ? Cetirizine-Pseudoephedrine (ZYRTEC-D PO) Take 1 tablet by mouth daily as needed (Congestion and allergies).    ? Cholecalciferol (D3 ADULT) 25 MCG (1000 UT) CHEW Chew 1 Units by mouth daily.    ? meloxicam (MOBIC) 15 MG tablet Take 1 tablet (15 mg total) by mouth daily. 30 tablet 0  ? nitroGLYCERIN (NITROSTAT) 0.4 MG SL tablet Place 1 tablet (0.4 mg total) under the tongue every 5 (five) minutes as needed for chest pain. 90 tablet 3  ? VENTOLIN HFA 108 (90 Base) MCG/ACT inhaler SMARTSIG:1-2 Puff(s) By Mouth Every 6 Hours    ? ?Current Facility-Administered Medications on File Prior to Visit  ?Medication Dose Route Frequency Provider Last Rate Last Admin  ? ipratropium-albuterol (DUONEB) 0.5-2.5 (3) MG/3ML nebulizer solution 3 mL  3 mL Nebulization Q6H Padgett, Rae Halsted, MD      ? ? ?Allergies  ?Allergen Reactions  ? Ciprofloxacin Hcl Swelling  ? Propofol Other (See Comments)  ?  Severe Hypotension   ? Wellbutrin [Bupropion] Other (See Comments)  ?  Hallucinations  ? ? ?Objective:  ?General: Alert and oriented x3 in no acute distress ? ?Dermatology: No open lesions bilateral lower extremities, no webspace macerations, no ecchymosis bilateral, all nails x 10 are well manicured, minimal callus left greater than right foot plantar fifth metatarsal head.. ? ?Vascular: Dorsalis Pedis and Posterior Tibial pedal pulses faintly palpable, slightly  decreased temperature gradient on left, capillary Fill Time 3 seconds,(+) pedal hair growth bilateral, minimal edema left foot and ankle with varicosities and mild purple hue to the left greater than right foot like previous. ? ?Neurology: Gross sensation intact via light touch bilateral.  Subjective sharp shooting pains over the deep peroneal and superficial peroneal nerve courses of the left foot and ankle to level of below the knee with history of sciatica ? ?Musculoskeletal: Unchanged diffuse tenderness to  the left foot and ankle.    Strength within normal limits in all groups bilateral except left with guarding due to pain with 4/5 weakness noted. ? ?Gait: Unassisted ? ?Assessment and Plan: ?Problem List Items Addressed This Visit   ?None ?Visit Diagnoses   ? ? Complex regional pain syndrome type 2 of left lower extremity    -  Primary  ? Relevant Medications  ? ibuprofen (ADVIL) 800 MG tablet  ? Complex regional pain syndrome type 1 of left lower extremity      ? Relevant Medications  ? diclofenac (FLECTOR) 1.3 % PTCH  ? ibuprofen (ADVIL) 800 MG tablet  ? Injury of left ankle, sequela      ? Chronic pain of left ankle      ? Relevant Medications  ? ibuprofen (ADVIL) 800 MG tablet  ? Left foot pain      ? ?  ?  ?-Complete examination performed ?-Re-Discussed with patient her long-term outcome of her chronic pain condition ?-Advised patient that at this point her case manager needs to close the case or adhere to what her lawyer is saying about her overall condition.  Patient has reached maximum medical improvement with no anticipating that patient will ever get better from this chronic condition ?-Department of Labor paperwork completed for patient with no work due to chronic pain condition. ?-Refilled ibuprofen for patient to take in place of meloxicam for pain and inflammation to the left foot and ankle ?-Prescribed Flector patches for patient to use as directed as needed for pain and left foot and ankle ?-Advised patient to continue with use of walker for stability due to chronic pain issue ?-Continue with limited activities due to pain condition ?Return to office as needed or sooner if problems or issues arise. ? ?Landis Martins, DPM ? ?

## 2021-08-17 ENCOUNTER — Ambulatory Visit (INDEPENDENT_AMBULATORY_CARE_PROVIDER_SITE_OTHER): Admitting: Podiatry

## 2021-08-17 ENCOUNTER — Encounter: Payer: Self-pay | Admitting: Podiatry

## 2021-08-17 DIAGNOSIS — G90522 Complex regional pain syndrome I of left lower limb: Secondary | ICD-10-CM | POA: Diagnosis not present

## 2021-08-17 DIAGNOSIS — S99912S Unspecified injury of left ankle, sequela: Secondary | ICD-10-CM

## 2021-08-17 DIAGNOSIS — G5772 Causalgia of left lower limb: Secondary | ICD-10-CM

## 2021-08-17 MED ORDER — MELOXICAM 15 MG PO TABS: 15.0000 mg | ORAL_TABLET | Freq: Every day | ORAL | 0 refills | Status: DC

## 2021-08-17 NOTE — Progress Notes (Signed)
Subjective: Sherri Arnold is a 51 y.o. female patient who returns to office for follow-up evaluation of left ankle pain.  Patient had injury 04/19/2017 work-related and still has pain and is currently being managed for chronic regional pain syndrome. Has been in the care of Dr. Cannon Kettle. Relates pain has continued the same Relates things have stabilized but still dealing with her chronic pain issues.  Patient denies any other pedal complaints at this time.  Patient Active Problem List   Diagnosis Date Noted   OSA (obstructive sleep apnea) 12/10/2020   Chronic pain syndrome 09/22/2020   Dizziness 09/22/2020   IBS (irritable bowel syndrome) 09/22/2020   Cancer (HCC)    COPD (chronic obstructive pulmonary disease) (HCC)    Cough    GERD (gastroesophageal reflux disease)    Memory loss    Muscle pain    Mixed hyperlipidemia    Muscle spasm of back    Nonscarring hair loss, unspecified    Palpitations    Poor circulation    Stroke ([place])    Tarsal tunnel syndrome 03/14/2020   Complex regional pain syndrome type II of left lower limb 02/28/2019   Left lower quadrant pain 02/07/2019   Neck mass 12/18/2018   Referred ear pain, right 12/18/2018   Cigarette smoker 08/15/2018   Hoarse 08/15/2018   Sialadenitis 08/15/2018   Submandibular gland hypertrophy 08/15/2018   Vocal cord polyp 08/15/2018   Bilateral carotid artery stenosis 03/24/2017   Left subclavian artery occlusion 03/24/2017   Occlusion of vertebral artery 03/24/2017   Chest pain 07/27/2016   Ventricular premature beats 07/27/2016    Current Outpatient Medications on File Prior to Visit  Medication Sig Dispense Refill   atorvastatin (LIPITOR) 80 MG tablet Take 80 mg by mouth daily.     Budeson-Glycopyrrol-Formoterol (BREZTRI AEROSPHERE) 160-9-4.8 MCG/ACT AERO Inhale 2 puffs into the lungs daily.     Cetirizine-Pseudoephedrine (ZYRTEC-D PO) Take 1 tablet by mouth daily as needed (Congestion and allergies).     Cholecalciferol  (D3 ADULT) 25 MCG (1000 UT) CHEW Chew 1 Units by mouth daily.     diclofenac (FLECTOR) 1.3 % PTCH Place 1 patch onto the skin daily. Max of 12 hours 60 patch 1   ibuprofen (ADVIL) 800 MG tablet Take 1 tablet (800 mg total) by mouth every 8 (eight) hours as needed. 30 tablet 0   meloxicam (MOBIC) 15 MG tablet Take 1 tablet (15 mg total) by mouth daily. 30 tablet 0   nitroGLYCERIN (NITROSTAT) 0.4 MG SL tablet Place 1 tablet (0.4 mg total) under the tongue every 5 (five) minutes as needed for chest pain. 90 tablet 3   VENTOLIN HFA 108 (90 Base) MCG/ACT inhaler SMARTSIG:1-2 Puff(s) By Mouth Every 6 Hours     Current Facility-Administered Medications on File Prior to Visit  Medication Dose Route Frequency Provider Last Rate Last Admin   ipratropium-albuterol (DUONEB) 0.5-2.5 (3) MG/3ML nebulizer solution 3 mL  3 mL Nebulization Q6H Padgett, Rae Halsted, MD        Allergies  Allergen Reactions   Ciprofloxacin Hcl Swelling   Propofol Other (See Comments)    Severe Hypotension    Wellbutrin [Bupropion] Other (See Comments)    Hallucinations    Objective:  General: Alert and oriented x3 in no acute distress  Dermatology: No open lesions bilateral lower extremities, no webspace macerations, no ecchymosis bilateral, all nails x 10 are well manicured, minimal callus left greater than right foot plantar fifth metatarsal head..  Vascular: Dorsalis Pedis and  Posterior Tibial pedal pulses faintly palpable, slightly decreased temperature gradient on left, capillary Fill Time 3 seconds,(+) pedal hair growth bilateral, minimal edema left foot and ankle with varicosities and mild purple hue to the left greater than right foot like previous.  Neurology: Johney Maine sensation intact via light touch bilateral.  Subjective sharp shooting pains over the deep peroneal and superficial peroneal nerve courses of the left foot and ankle to level of below the knee with history of sciatica  Musculoskeletal: Unchanged  diffuse tenderness to the left foot and ankle.    Strength within normal limits in all groups bilateral except left with guarding due to pain with 4/5 weakness noted.  Gait: Unassisted  Assessment and Plan: Problem List Items Addressed This Visit   None    -Complete examination performed -Re-Discussed with patient her long-term outcome of her chronic pain condition -Advised patient that at this point her case manager needs to close the case or adhere to what her lawyer is saying about her overall condition.  Patient has reached maximum medical improvement with no anticipating that patient will ever get better from this chronic condition -[employer] paperwork completed for patient with no work due to chronic pain condition. -Refilled meloxicam for patient to take in place of meloxicam for pain and inflammation to the left foot and ankle -Advised patient to continue with use of walker for stability due to chronic pain issue -Continue with limited activities due to pain condition Return to office as needed or sooner if problems or issues arise.  Lorenda Peck, DPM

## 2021-09-14 ENCOUNTER — Other Ambulatory Visit: Payer: Self-pay | Admitting: Podiatry

## 2021-10-07 ENCOUNTER — Other Ambulatory Visit: Payer: Self-pay | Admitting: Podiatry

## 2021-11-17 ENCOUNTER — Ambulatory Visit (INDEPENDENT_AMBULATORY_CARE_PROVIDER_SITE_OTHER): Admitting: Podiatry

## 2021-11-17 DIAGNOSIS — M25572 Pain in left ankle and joints of left foot: Secondary | ICD-10-CM

## 2021-11-17 DIAGNOSIS — I739 Peripheral vascular disease, unspecified: Secondary | ICD-10-CM

## 2021-11-17 DIAGNOSIS — G8929 Other chronic pain: Secondary | ICD-10-CM

## 2021-11-17 DIAGNOSIS — S99912S Unspecified injury of left ankle, sequela: Secondary | ICD-10-CM

## 2021-11-17 DIAGNOSIS — G5772 Causalgia of left lower limb: Secondary | ICD-10-CM

## 2021-11-17 MED ORDER — IBUPROFEN 800 MG PO TABS: 800.0000 mg | ORAL_TABLET | Freq: Three times a day (TID) | ORAL | 3 refills | Status: AC | PRN

## 2021-11-17 NOTE — Progress Notes (Signed)
Subjective:  Patient ID: Sherri Arnold, female    DOB: 24-Apr-1970,  MRN: [id number]  Chief Complaint  Patient presents with   Follow-up    3 month follow up left foot/ankle pain- Nerve test done before. Patient explains that her leg and foot becomes cold and numbness.     51 y.o. female presents with concern for left ankle pain.  She does have a history of CRPS type II of the left lower extremity.  This has been treated by Dr. Cannon Kettle and Dr. Blenda Mounts in the past.  This is my first time seeing her.  She has tried many different nerve medications.  She does not take gabapentin or Lyrica due to headaches.  She has been alternating between meloxicam and ibuprofen for some pain control and swelling relief of the left lower extremity.  She says she needs this to decrease her swelling primarily.  She is not currently working.  She also reports he has seen a neurologist in [place] who offered her a spinal nerve stimulator.  She deferred at the time but is wondering if she should maybe talk about going back to have that done.  Past Medical History:  Diagnosis Date   A-fib [facility])    Bilateral carotid artery stenosis 03/24/2017   Cancer (HCC)    Chest pain 07/27/2016   Chronic pain syndrome    Cigarette smoker 08/15/2018   Complex regional pain syndrome type II of left lower limb 02/28/2019   COPD (chronic obstructive pulmonary disease) (HCC)    Coronary artery disease    Cough    Dizziness    GERD (gastroesophageal reflux disease)    IBS (irritable bowel syndrome)    Left lower quadrant pain 02/07/2019   Left subclavian artery occlusion 03/24/2017   Memory loss    Mixed hyperlipidemia    Muscle pain    Muscle spasm of back    Neck mass 12/18/2018   Nonscarring hair loss, unspecified    Occlusion of vertebral artery 03/24/2017   Palpitations    Poor circulation    Referred ear pain, right 12/18/2018   Sialadenitis 08/15/2018   Stroke ([place])    mini-stroke   Submandibular gland hypertrophy  08/15/2018   Tarsal tunnel syndrome 03/14/2020   Ventricular premature beats 07/27/2016   Vocal cord polyp 08/15/2018    Allergies  Allergen Reactions   Ciprofloxacin Hcl Swelling   Propofol Other (See Comments)    Severe Hypotension    Wellbutrin [Bupropion] Other (See Comments)    Hallucinations    ROS: Negative except as per HPI above  Objective:  General: AAO x3, NAD   Dermatology: No open lesions bilateral lower extremities, no webspace macerations, no ecchymosis bilateral, all nails x 10 are well manicured, minimal callus left greater than right foot plantar fifth metatarsal head..   Vascular: Dorsalis Pedis and Posterior Tibial pedal pulses faintly palpable, slightly decreased temperature gradient on left, capillary Fill Time 3 seconds,(+) pedal hair growth bilateral, minimal edema left foot and ankle with varicosities and mild purple hue to the left greater than right foot like previous.   Neurology: Johney Maine sensation intact via light touch bilateral.  Subjective sharp shooting pains over the deep peroneal and superficial peroneal nerve courses of the left foot and ankle to level of below the knee with history of sciatica   Musculoskeletal: Unchanged diffuse tenderness to the left foot and ankle.    Strength within normal limits in all groups bilateral except left with guarding due to pain with  4/5 weakness noted.  Gait: Unassisted, Nonantalgic.    Assessment:   1. Complex regional pain syndrome type 2 of left lower extremity   2. Injury of left ankle, sequela   3. Chronic pain of left ankle   4. PVD (peripheral vascular disease) ([place])      Plan:  Patient was evaluated and treated and all questions answered.  Complete examination performed #CRPS type II left lower extremity -Re-Discussed with patient her long-term outcome of her chronic pain condition -[employer] paperwork completed for patient with no work due to chronic pain condition. -E- Rx for ibuprofen  800 mg 3 times daily sent  for patient to take in place of meloxicam for pain and inflammation to the left foot and ankle -Continue with limited activities due to pain condition -I discussed with the patient in depth regarding neurology recommendation for spinal nerve stimulator.  I do recommend she proceed with this.  Patient states she will call the neurology office in [place] to see if she can get back in.  If she needs another referral I will place that order.  I do believe she may see some benefit from spinal nerve stimulator in regards to her chronic regional pain syndrome of the left lower extremity.   Return in about 3 months (around 02/17/2022) for Follow-up CRPS type II.          Everitt Amber, DPM [facility] [place]

## 2021-11-17 NOTE — Progress Notes (Deleted)
Subjective:  Patient ID: Sherri Arnold, female    DOB: 04/09/1970,  MRN: [id number]  Chief Complaint  Patient presents with   Follow-up    3 month follow up left foot/ankle pain- Nerve test done before. Patient explains that her leg and foot becomes cold and numbness.     51 y.o. female presents with concern for left ankle pain.  She does have a history of CRPS type II of the left lower extremity.  This has been treated by Dr. Cannon Kettle and Dr. Blenda Mounts in the past.  This is my first time seeing her.  She has tried many different nerve medications.  She does not take gabapentin or Lyrica due to headaches.  She has been alternating between meloxicam and ibuprofen for some pain control and swelling relief of the left lower extremity.  She says she needs this to decrease her swelling primarily.  She is not currently working.  She also reports he has seen a neurologist in [place] who offered her a spinal nerve stimulator.  She deferred at the time but is wondering if she should maybe talk about going back to have that done.  Past Medical History:  Diagnosis Date   A-fib [facility])    Bilateral carotid artery stenosis 03/24/2017   Cancer (HCC)    Chest pain 07/27/2016   Chronic pain syndrome    Cigarette smoker 08/15/2018   Complex regional pain syndrome type II of left lower limb 02/28/2019   COPD (chronic obstructive pulmonary disease) (HCC)    Coronary artery disease    Cough    Dizziness    GERD (gastroesophageal reflux disease)    IBS (irritable bowel syndrome)    Left lower quadrant pain 02/07/2019   Left subclavian artery occlusion 03/24/2017   Memory loss    Mixed hyperlipidemia    Muscle pain    Muscle spasm of back    Neck mass 12/18/2018   Nonscarring hair loss, unspecified    Occlusion of vertebral artery 03/24/2017   Palpitations    Poor circulation    Referred ear pain, right 12/18/2018   Sialadenitis 08/15/2018   Stroke ([place])    mini-stroke   Submandibular gland hypertrophy  08/15/2018   Tarsal tunnel syndrome 03/14/2020   Ventricular premature beats 07/27/2016   Vocal cord polyp 08/15/2018    Allergies  Allergen Reactions   Ciprofloxacin Hcl Swelling   Propofol Other (See Comments)    Severe Hypotension    Wellbutrin [Bupropion] Other (See Comments)    Hallucinations    ROS: Negative except as per HPI above  Objective:  General: AAO x3, NAD   Dermatology: No open lesions bilateral lower extremities, no webspace macerations, no ecchymosis bilateral, all nails x 10 are well manicured, minimal callus left greater than right foot plantar fifth metatarsal head..   Vascular: Dorsalis Pedis and Posterior Tibial pedal pulses faintly palpable, slightly decreased temperature gradient on left, capillary Fill Time 3 seconds,(+) pedal hair growth bilateral, minimal edema left foot and ankle with varicosities and mild purple hue to the left greater than right foot like previous.   Neurology: Johney Maine sensation intact via light touch bilateral.  Subjective sharp shooting pains over the deep peroneal and superficial peroneal nerve courses of the left foot and ankle to level of below the knee with history of sciatica   Musculoskeletal: Unchanged diffuse tenderness to the left foot and ankle.    Strength within normal limits in all groups bilateral except left with guarding due to pain with  4/5 weakness noted.  Gait: Unassisted, Nonantalgic.    Assessment:   1. Complex regional pain syndrome type 2 of left lower extremity   2. Injury of left ankle, sequela   3. Chronic pain of left ankle   4. PVD (peripheral vascular disease) ([place])      Plan:  Patient was evaluated and treated and all questions answered.  Complete examination performed #CRPS type II left lower extremity -Re-Discussed with patient her long-term outcome of her chronic pain condition -[employer] paperwork completed for patient with no work due to chronic pain condition. -E- Rx for ibuprofen  800 mg 3 times daily sent  for patient to take in place of meloxicam for pain and inflammation to the left foot and ankle -Continue with limited activities due to pain condition -I discussed with the patient in depth regarding neurology recommendation for spinal nerve stimulator.  I do recommend she proceed with this.  Patient states she will call the neurology office in [place] to see if she can get back in.  If she needs another referral I will place that order.  I do believe she may see some benefit from spinal nerve stimulator in regards to her chronic regional pain syndrome of the left lower extremity.  Return to office as needed or sooner if problems or issues arise.  No follow-ups on file.          Everitt Amber, DPM [facility] [place] [facility]

## 2022-02-17 ENCOUNTER — Ambulatory Visit (INDEPENDENT_AMBULATORY_CARE_PROVIDER_SITE_OTHER): Admitting: Podiatry

## 2022-02-17 ENCOUNTER — Encounter: Payer: Self-pay | Admitting: Podiatry

## 2022-02-17 DIAGNOSIS — M25572 Pain in left ankle and joints of left foot: Secondary | ICD-10-CM | POA: Diagnosis not present

## 2022-02-17 DIAGNOSIS — G8929 Other chronic pain: Secondary | ICD-10-CM

## 2022-02-17 DIAGNOSIS — G5772 Causalgia of left lower limb: Secondary | ICD-10-CM

## 2022-02-17 MED ORDER — MELOXICAM 15 MG PO TABS: 15.0000 mg | ORAL_TABLET | Freq: Every day | ORAL | 0 refills | Status: DC

## 2022-02-17 NOTE — Progress Notes (Signed)
Subjective: Sherri Arnold is a 52 y.o. female patient who returns to office for follow-up evaluation of left ankle pain.  Patient had injury 04/19/2017 work-related and still has pain and is currently being managed for chronic regional pain syndrome. Has been in the care of Dr. Cannon Kettle in the past.  Relates pain has continued the same.  Relates things have stabilized but still dealing with her chronic pain issues. She alternates taking meloxicam and ibuprofen.  Patient denies any other pedal complaints at this time.  Patient Active Problem List   Diagnosis Date Noted   OSA (obstructive sleep apnea) 12/10/2020   Chronic pain syndrome 09/22/2020   Dizziness 09/22/2020   IBS (irritable bowel syndrome) 09/22/2020   Cancer (HCC)    COPD (chronic obstructive pulmonary disease) (HCC)    Cough    GERD (gastroesophageal reflux disease)    Memory loss    Muscle pain    Mixed hyperlipidemia    Muscle spasm of back    Nonscarring hair loss, unspecified    Palpitations    Poor circulation    Stroke ([place])    Tarsal tunnel syndrome 03/14/2020   Complex regional pain syndrome type II of left lower limb 02/28/2019   Left lower quadrant pain 02/07/2019   Neck mass 12/18/2018   Referred ear pain, right 12/18/2018   Cigarette smoker 08/15/2018   Hoarse 08/15/2018   Sialadenitis 08/15/2018   Submandibular gland hypertrophy 08/15/2018   Vocal cord polyp 08/15/2018   Bilateral carotid artery stenosis 03/24/2017   Left subclavian artery occlusion 03/24/2017   Occlusion of vertebral artery 03/24/2017   Chest pain 07/27/2016   Ventricular premature beats 07/27/2016    Current Outpatient Medications on File Prior to Visit  Medication Sig Dispense Refill   atorvastatin (LIPITOR) 80 MG tablet Take 80 mg by mouth daily.     Budeson-Glycopyrrol-Formoterol (BREZTRI AEROSPHERE) 160-9-4.8 MCG/ACT AERO Inhale 2 puffs into the lungs daily.     Cetirizine-Pseudoephedrine (ZYRTEC-D PO) Take 1 tablet by mouth  daily as needed (Congestion and allergies).     Cholecalciferol (D3 ADULT) 25 MCG (1000 UT) CHEW Chew 1 Units by mouth daily.     diclofenac (FLECTOR) 1.3 % PTCH Place 1 patch onto the skin daily. Max of 12 hours 60 patch 1   ibuprofen (ADVIL) 800 MG tablet Take 1 tablet (800 mg total) by mouth every 8 (eight) hours as needed. 30 tablet 0   nitroGLYCERIN (NITROSTAT) 0.4 MG SL tablet Place 1 tablet (0.4 mg total) under the tongue every 5 (five) minutes as needed for chest pain. 90 tablet 3   VENTOLIN HFA 108 (90 Base) MCG/ACT inhaler SMARTSIG:1-2 Puff(s) By Mouth Every 6 Hours     Current Facility-Administered Medications on File Prior to Visit  Medication Dose Route Frequency Provider Last Rate Last Admin   ipratropium-albuterol (DUONEB) 0.5-2.5 (3) MG/3ML nebulizer solution 3 mL  3 mL Nebulization Q6H Padgett, Rae Halsted, MD        Allergies  Allergen Reactions   Ciprofloxacin Hcl Swelling   Propofol Other (See Comments)    Severe Hypotension    Wellbutrin [Bupropion] Other (See Comments)    Hallucinations    Objective:  General: Alert and oriented x3 in no acute distress  Dermatology: No open lesions bilateral lower extremities, no webspace macerations, no ecchymosis bilateral, all nails x 10 are well manicured, minimal callus left greater than right foot plantar fifth metatarsal head..  Vascular: Dorsalis Pedis and Posterior Tibial pedal pulses faintly palpable, slightly decreased  temperature gradient on left, capillary Fill Time 3 seconds,(+) pedal hair growth bilateral, minimal edema left foot and ankle with varicosities and mild purple hue to the left greater than right foot like previous.  Neurology: Johney Maine sensation intact via light touch bilateral.  Subjective sharp shooting pains over the deep peroneal and superficial peroneal nerve courses of the left foot and ankle to level of below the knee with history of sciatica  Musculoskeletal: Unchanged diffuse tenderness to the  left foot and ankle.    Strength within normal limits in all groups bilateral except left with guarding due to pain with 4/5 weakness noted.  Gait: Unassisted  Assessment and Plan: Problem List Items Addressed This Visit   None Visit Diagnoses     Complex regional pain syndrome type 2 of left lower extremity    -  Primary   Relevant Medications   meloxicam (MOBIC) 15 MG tablet   Chronic pain of left ankle       Relevant Medications   meloxicam (MOBIC) 15 MG tablet        -Complete examination performed -Re-Discussed with patient her long-term outcome of her chronic pain condition -  Patient has reached maximum medical improvement with no anticipating that patient will ever get better from this chronic condition -[employer] paperwork patient forgot and will bring in paperwork laterl to be filled out -Continue althernating between meloxicam and ibuprofen. Refill of meloxicam -Advised patient to continue with use of walker for stability due to chronic pain issue -Continue with limited activities due to pain condition -I discussed with the patient in depth regarding neurology recommendation for spinal nerve stimulator. She will look into again. Patient states she will call the neurology office in [place] to see if she can get back in. I do believe she may see some benefit from spinal nerve stimulator in regards to her chronic regional pain syndrome of the left lower extremity.   Lorenda Peck, DPM

## 2022-02-19 ENCOUNTER — Ambulatory Visit: Payer: Medicare HMO | Admitting: Podiatry

## 2022-05-17 ENCOUNTER — Ambulatory Visit: Payer: Medicare Other | Admitting: Podiatry

## 2022-05-24 ENCOUNTER — Ambulatory Visit (INDEPENDENT_AMBULATORY_CARE_PROVIDER_SITE_OTHER): Admitting: Podiatry

## 2022-05-24 ENCOUNTER — Encounter: Payer: Self-pay | Admitting: Podiatry

## 2022-05-24 DIAGNOSIS — M25572 Pain in left ankle and joints of left foot: Secondary | ICD-10-CM | POA: Diagnosis not present

## 2022-05-24 DIAGNOSIS — G5772 Causalgia of left lower limb: Secondary | ICD-10-CM | POA: Diagnosis not present

## 2022-05-24 DIAGNOSIS — G8929 Other chronic pain: Secondary | ICD-10-CM

## 2022-05-24 NOTE — Progress Notes (Signed)
Subjective: Sherri Arnold is a 52 y.o. female patient who returns to office for follow-up evaluation of left ankle pain.  Patient had injury 04/19/2017 work-related and still has pain and is currently being managed for chronic regional pain syndrome. Has been in the care of Dr. Marylene Land in the past.  Relates pain has continued the same.  Relates things have stabilized but still dealing with her chronic pain issues. She alternates taking meloxicam and ibuprofen.  Patient denies any other pedal complaints at this time.  Patient Active Problem List   Diagnosis Date Noted   OSA (obstructive sleep apnea) 12/10/2020   Chronic pain syndrome 09/22/2020   Dizziness 09/22/2020   IBS (irritable bowel syndrome) 09/22/2020   Cancer (HCC)    COPD (chronic obstructive pulmonary disease) (HCC)    Cough    GERD (gastroesophageal reflux disease)    Memory loss    Muscle pain    Mixed hyperlipidemia    Muscle spasm of back    Nonscarring hair loss, unspecified    Palpitations    Poor circulation    Stroke (HCC)    Tarsal tunnel syndrome 03/14/2020   Complex regional pain syndrome type II of left lower limb 02/28/2019   Left lower quadrant pain 02/07/2019   Neck mass 12/18/2018   Referred ear pain, right 12/18/2018   Cigarette smoker 08/15/2018   Hoarse 08/15/2018   Sialadenitis 08/15/2018   Submandibular gland hypertrophy 08/15/2018   Vocal cord polyp 08/15/2018   Bilateral carotid artery stenosis 03/24/2017   Left subclavian artery occlusion 03/24/2017   Occlusion of vertebral artery 03/24/2017   Chest pain 07/27/2016   Ventricular premature beats 07/27/2016    Current Outpatient Medications on File Prior to Visit  Medication Sig Dispense Refill   atorvastatin (LIPITOR) 80 MG tablet Take 80 mg by mouth daily.     Budeson-Glycopyrrol-Formoterol (BREZTRI AEROSPHERE) 160-9-4.8 MCG/ACT AERO Inhale 2 puffs into the lungs daily.     Cetirizine-Pseudoephedrine (ZYRTEC-D PO) Take 1 tablet by mouth  daily as needed (Congestion and allergies).     Cholecalciferol (D3 ADULT) 25 MCG (1000 UT) CHEW Chew 1 Units by mouth daily.     diclofenac (FLECTOR) 1.3 % PTCH Place 1 patch onto the skin daily. Max of 12 hours 60 patch 1   ibuprofen (ADVIL) 800 MG tablet Take 1 tablet (800 mg total) by mouth every 8 (eight) hours as needed. 30 tablet 0   meloxicam (MOBIC) 15 MG tablet Take 1 tablet (15 mg total) by mouth daily. 30 tablet 0   nitroGLYCERIN (NITROSTAT) 0.4 MG SL tablet Place 1 tablet (0.4 mg total) under the tongue every 5 (five) minutes as needed for chest pain. 90 tablet 3   VENTOLIN HFA 108 (90 Base) MCG/ACT inhaler SMARTSIG:1-2 Puff(s) By Mouth Every 6 Hours     Current Facility-Administered Medications on File Prior to Visit  Medication Dose Route Frequency Provider Last Rate Last Admin   ipratropium-albuterol (DUONEB) 0.5-2.5 (3) MG/3ML nebulizer solution 3 mL  3 mL Nebulization Q6H Padgett, Pilar Grammes, MD        Allergies  Allergen Reactions   Ciprofloxacin Hcl Swelling   Propofol Other (See Comments)    Severe Hypotension    Wellbutrin [Bupropion] Other (See Comments)    Hallucinations    Objective:  General: Alert and oriented x3 in no acute distress  Dermatology: No open lesions bilateral lower extremities, no webspace macerations, no ecchymosis bilateral, all nails x 10 are well manicured, minimal callus left greater than  right foot plantar fifth metatarsal head..  Vascular: Dorsalis Pedis and Posterior Tibial pedal pulses faintly palpable, slightly decreased temperature gradient on left, capillary Fill Time 3 seconds,(+) pedal hair growth bilateral, minimal edema left foot and ankle with varicosities and mild purple hue to the left greater than right foot like previous.  Neurology: Michaell Cowing sensation intact via light touch bilateral.  Subjective sharp shooting pains over the deep peroneal and superficial peroneal nerve courses of the left foot and ankle to level of below  the knee with history of sciatica  Musculoskeletal: Unchanged diffuse tenderness to the left foot and ankle.    Strength within normal limits in all groups bilateral except left with guarding due to pain with 4/5 weakness noted.  Gait: Unassisted  Assessment and Plan: Problem List Items Addressed This Visit   None Visit Diagnoses     Complex regional pain syndrome type 2 of left lower extremity    -  Primary   Chronic pain of left ankle             -Complete examination performed -Re-Discussed with patient her long-term outcome of her chronic pain condition -  Patient has reached maximum medical improvement with no anticipating that patient will ever get better from this chronic condition -[employer] paperwork patient forgot and will bring in paperwork laterl to be filled out -Continue althernating between meloxicam and ibuprofen. Refill of meloxicam -Advised patient to continue with use of walker for stability due to chronic pain issue -Continue with limited activities due to pain condition -I discussed with the patient in depth regarding neurology recommendation for spinal nerve stimulator. She will look into again. Patient states she will call the neurology office in [place] to see if she can get back in. I do believe she may see some benefit from spinal nerve stimulator in regards to her chronic regional pain syndrome of the left lower extremity.  Patient to return in 3 months to refill out paperwork.   Louann Sjogren, DPM

## 2022-06-29 ENCOUNTER — Other Ambulatory Visit: Payer: Self-pay | Admitting: Podiatry

## 2022-08-23 ENCOUNTER — Encounter: Payer: Self-pay | Admitting: Podiatry

## 2022-08-23 ENCOUNTER — Ambulatory Visit (INDEPENDENT_AMBULATORY_CARE_PROVIDER_SITE_OTHER): Payer: Medicare Other | Admitting: Podiatry

## 2022-08-23 DIAGNOSIS — S99912S Unspecified injury of left ankle, sequela: Secondary | ICD-10-CM

## 2022-08-23 DIAGNOSIS — G5772 Causalgia of left lower limb: Secondary | ICD-10-CM | POA: Diagnosis not present

## 2022-08-23 DIAGNOSIS — I739 Peripheral vascular disease, unspecified: Secondary | ICD-10-CM | POA: Diagnosis not present

## 2022-08-23 NOTE — Progress Notes (Signed)
Subjective: Sherri Arnold is a 52 y.o. female patient who returns to office for follow-up evaluation of left ankle pain.  Patient had injury 04/19/2017 work-related and still has pain and is currently being managed for chronic regional pain syndrome. Has been in the care of Dr. Marylene Land in the past.  Relates pain has continued the same.  Relates things have stabilized but still dealing with her chronic pain issues. She has been taking ibuprofen recently but relates worse in warm weather.  Patient denies any other pedal complaints at this time.  Patient Active Problem List   Diagnosis Date Noted   OSA (obstructive sleep apnea) 12/10/2020   Chronic pain syndrome 09/22/2020   Dizziness 09/22/2020   IBS (irritable bowel syndrome) 09/22/2020   Cancer (HCC)    COPD (chronic obstructive pulmonary disease) (HCC)    Cough    GERD (gastroesophageal reflux disease)    Memory loss    Muscle pain    Mixed hyperlipidemia    Muscle spasm of back    Nonscarring hair loss, unspecified    Palpitations    Poor circulation    Stroke (HCC)    Tarsal tunnel syndrome 03/14/2020   Complex regional pain syndrome type II of left lower limb 02/28/2019   Left lower quadrant pain 02/07/2019   Neck mass 12/18/2018   Referred ear pain, right 12/18/2018   Cigarette smoker 08/15/2018   Hoarse 08/15/2018   Sialadenitis 08/15/2018   Submandibular gland hypertrophy 08/15/2018   Vocal cord polyp 08/15/2018   Bilateral carotid artery stenosis 03/24/2017   Left subclavian artery occlusion 03/24/2017   Occlusion of vertebral artery 03/24/2017   Chest pain 07/27/2016   Ventricular premature beats 07/27/2016    Current Outpatient Medications on File Prior to Visit  Medication Sig Dispense Refill   atorvastatin (LIPITOR) 80 MG tablet Take 80 mg by mouth daily.     Budeson-Glycopyrrol-Formoterol (BREZTRI AEROSPHERE) 160-9-4.8 MCG/ACT AERO Inhale 2 puffs into the lungs daily.     Cetirizine-Pseudoephedrine (ZYRTEC-D  PO) Take 1 tablet by mouth daily as needed (Congestion and allergies).     Cholecalciferol (D3 ADULT) 25 MCG (1000 UT) CHEW Chew 1 Units by mouth daily.     diclofenac (FLECTOR) 1.3 % PTCH Place 1 patch onto the skin daily. Max of 12 hours 60 patch 1   ibuprofen (ADVIL) 800 MG tablet Take 1 tablet (800 mg total) by mouth every 8 (eight) hours as needed. 30 tablet 0   meloxicam (MOBIC) 15 MG tablet Take 1 tablet by mouth once daily 30 tablet 1   VENTOLIN HFA 108 (90 Base) MCG/ACT inhaler SMARTSIG:1-2 Puff(s) By Mouth Every 6 Hours     nitroGLYCERIN (NITROSTAT) 0.4 MG SL tablet Place 1 tablet (0.4 mg total) under the tongue every 5 (five) minutes as needed for chest pain. 90 tablet 3   Current Facility-Administered Medications on File Prior to Visit  Medication Dose Route Frequency Provider Last Rate Last Admin   ipratropium-albuterol (DUONEB) 0.5-2.5 (3) MG/3ML nebulizer solution 3 mL  3 mL Nebulization Q6H Padgett, Pilar Grammes, MD        Allergies  Allergen Reactions   Ciprofloxacin Hcl Swelling   Propofol Other (See Comments)    Severe Hypotension    Wellbutrin [Bupropion] Other (See Comments)    Hallucinations    Objective:  General: Alert and oriented x3 in no acute distress  Dermatology: No open lesions bilateral lower extremities, no webspace macerations, no ecchymosis bilateral, all nails x 10 are well manicured, minimal  callus left greater than right foot plantar fifth metatarsal head..  Vascular: Dorsalis Pedis and Posterior Tibial pedal pulses faintly palpable, slightly decreased temperature gradient on left, capillary Fill Time 3 seconds,(+) pedal hair growth bilateral, minimal edema left foot and ankle with varicosities and mild purple hue to the left greater than right foot like previous.  Neurology: Michaell Cowing sensation intact via light touch bilateral.  Subjective sharp shooting pains over the deep peroneal and superficial peroneal nerve courses of the left foot and ankle  to level of below the knee with history of sciatica  Musculoskeletal: Unchanged diffuse tenderness to the left foot and ankle.    Strength within normal limits in all groups bilateral except left with guarding due to pain with 4/5 weakness noted.  Gait: Unassisted  Assessment and Plan: Problem List Items Addressed This Visit   None Visit Diagnoses     Complex regional pain syndrome type 2 of left lower extremity    -  Primary   PVD (peripheral vascular disease) (HCC)       Injury of left ankle, sequela              -Complete examination performed -Re-Discussed with patient her long-term outcome of her chronic pain condition -  Patient has reached maximum medical improvement with no anticipating that patient will ever get better from this chronic condition -[employer] paperwork patient forgot and will bring in paperwork laterl to be filled out -Continue ibuprofen.  -Advised patient to continue with use of walker for stability due to chronic pain issue -Continue with limited activities due to pain condition -I discussed with the patient in depth regarding neurology recommendation for spinal nerve stimulator. She will look into again. Will refer again for her if needed.  I do believe she may see some benefit from spinal nerve stimulator in regards to her chronic regional pain syndrome of the left lower extremity even if it improves just a little.  Patient to return in 3 months to refill out paperwork.   Louann Sjogren, DPM

## 2022-11-07 ENCOUNTER — Other Ambulatory Visit: Payer: Self-pay | Admitting: Podiatry

## 2022-11-23 ENCOUNTER — Encounter: Payer: Self-pay | Admitting: Podiatry

## 2022-11-23 ENCOUNTER — Ambulatory Visit (INDEPENDENT_AMBULATORY_CARE_PROVIDER_SITE_OTHER): Payer: Self-pay | Admitting: Podiatry

## 2022-11-23 DIAGNOSIS — I739 Peripheral vascular disease, unspecified: Secondary | ICD-10-CM | POA: Diagnosis not present

## 2022-11-23 DIAGNOSIS — G5772 Causalgia of left lower limb: Secondary | ICD-10-CM

## 2022-11-23 NOTE — Progress Notes (Signed)
Subjective: Sherri Arnold is a 52 y.o. female patient who returns to office for follow-up evaluation of left ankle pain.  Patient had injury 04/19/2017 work-related and still has pain and is currently being managed for chronic regional pain syndrome. Has been in the care of Dr. Marylene Land in the past.  Relates pain has continued the same.  Relates things have stabilized but still dealing with her chronic pain issues. Relates pain a little worse recently with the cold weather.  Patient denies any other pedal complaints at this time.  Patient Active Problem List   Diagnosis Date Noted   OSA (obstructive sleep apnea) 12/10/2020   Chronic pain syndrome 09/22/2020   Dizziness 09/22/2020   IBS (irritable bowel syndrome) 09/22/2020   Cancer (HCC)    COPD (chronic obstructive pulmonary disease) (HCC)    Cough    GERD (gastroesophageal reflux disease)    Memory loss    Muscle pain    Mixed hyperlipidemia    Muscle spasm of back    Nonscarring hair loss, unspecified    Palpitations    Poor circulation    Stroke (HCC)    Tarsal tunnel syndrome 03/14/2020   Complex regional pain syndrome type II of left lower limb 02/28/2019   Left lower quadrant pain 02/07/2019   Neck mass 12/18/2018   Referred ear pain, right 12/18/2018   Cigarette smoker 08/15/2018   Hoarse 08/15/2018   Sialadenitis 08/15/2018   Submandibular gland hypertrophy 08/15/2018   Vocal cord polyp 08/15/2018   Bilateral carotid artery stenosis 03/24/2017   Left subclavian artery occlusion 03/24/2017   Occlusion of vertebral artery 03/24/2017   Chest pain 07/27/2016   Ventricular premature beats 07/27/2016    Current Outpatient Medications on File Prior to Visit  Medication Sig Dispense Refill   atorvastatin (LIPITOR) 80 MG tablet Take 80 mg by mouth daily.     Budeson-Glycopyrrol-Formoterol (BREZTRI AEROSPHERE) 160-9-4.8 MCG/ACT AERO Inhale 2 puffs into the lungs daily.     Cetirizine-Pseudoephedrine (ZYRTEC-D PO) Take 1  tablet by mouth daily as needed (Congestion and allergies).     Cholecalciferol (D3 ADULT) 25 MCG (1000 UT) CHEW Chew 1 Units by mouth daily.     diclofenac (FLECTOR) 1.3 % PTCH Place 1 patch onto the skin daily. Max of 12 hours 60 patch 1   ibuprofen (ADVIL) 800 MG tablet Take 1 tablet (800 mg total) by mouth every 8 (eight) hours as needed. 30 tablet 0   meloxicam (MOBIC) 15 MG tablet Take 1 tablet by mouth once daily 30 tablet 0   nitroGLYCERIN (NITROSTAT) 0.4 MG SL tablet Place 1 tablet (0.4 mg total) under the tongue every 5 (five) minutes as needed for chest pain. 90 tablet 3   VENTOLIN HFA 108 (90 Base) MCG/ACT inhaler SMARTSIG:1-2 Puff(s) By Mouth Every 6 Hours     Current Facility-Administered Medications on File Prior to Visit  Medication Dose Route Frequency Provider Last Rate Last Admin   ipratropium-albuterol (DUONEB) 0.5-2.5 (3) MG/3ML nebulizer solution 3 mL  3 mL Nebulization Q6H Padgett, Pilar Grammes, MD        Allergies  Allergen Reactions   Ciprofloxacin Hcl Swelling   Propofol Other (See Comments)    Severe Hypotension    Wellbutrin [Bupropion] Other (See Comments)    Hallucinations    Objective:  General: Alert and oriented x3 in no acute distress  Dermatology: No open lesions bilateral lower extremities, no webspace macerations, no ecchymosis bilateral, all nails x 10 are well manicured, minimal callus left  greater than right foot plantar fifth metatarsal head..  Vascular: Dorsalis Pedis and Posterior Tibial pedal pulses faintly palpable, slightly decreased temperature gradient on left, capillary Fill Time 3 seconds,(+) pedal hair growth bilateral, minimal edema left foot and ankle with varicosities and mild purple hue to the left greater than right foot like previous.  Neurology: Michaell Cowing sensation intact via light touch bilateral.  Subjective sharp shooting pains over the deep peroneal and superficial peroneal nerve courses of the left foot and ankle to level of  below the knee with history of sciatica  Musculoskeletal: Unchanged diffuse tenderness to the left foot and ankle.    Strength within normal limits in all groups bilateral except left with guarding due to pain with 4/5 weakness noted.  Gait: Unassisted  Assessment and Plan: Problem List Items Addressed This Visit   None Visit Diagnoses     Complex regional pain syndrome type 2 of left lower extremity    -  Primary   PVD (peripheral vascular disease) (HCC)               -Complete examination performed -Re-Discussed with patient her long-term outcome of her chronic pain condition -  Patient has reached maximum medical improvement with no anticipating that patient will ever get better from this chronic condition -[employer] paperwork filled out.  -Continue ibuprofen.  -Advised patient to continue with use of walker for stability due to chronic pain issue -Continue with limited activities due to pain condition -I discussed with the patient in depth regarding neurology recommendation for spinal nerve stimulator. She has appointment in June   I do believe she may see some benefit from spinal nerve stimulator in regards to her chronic regional pain syndrome of the left lower extremity even if it improves just a little.  Patient to return in 3 months to refill out paperwork.   Louann Sjogren, DPM

## 2022-12-06 ENCOUNTER — Other Ambulatory Visit: Payer: Self-pay | Admitting: Podiatry

## 2023-01-04 ENCOUNTER — Other Ambulatory Visit: Payer: Self-pay | Admitting: Podiatry

## 2023-01-17 ENCOUNTER — Telehealth: Payer: Self-pay | Admitting: Podiatry

## 2023-01-17 NOTE — Telephone Encounter (Signed)
 Completed workman's comp paperwork for this patient -- this has been an on-going process annually since 2020.  Last appointment was in November, 2024 with Dr. Sikora.  The completed form was mailed to Office of [employer], ([email or web address]);  [id number];  [place], [place]  [address].  No fax# was provided.       J. Abbott -- 01/17/2023

## 2023-02-23 ENCOUNTER — Ambulatory Visit (INDEPENDENT_AMBULATORY_CARE_PROVIDER_SITE_OTHER): Payer: Medicare Other | Admitting: Podiatry

## 2023-02-23 ENCOUNTER — Other Ambulatory Visit: Payer: Self-pay | Admitting: Podiatry

## 2023-02-23 DIAGNOSIS — G5772 Causalgia of left lower limb: Secondary | ICD-10-CM

## 2023-02-23 DIAGNOSIS — I739 Peripheral vascular disease, unspecified: Secondary | ICD-10-CM

## 2023-02-23 DIAGNOSIS — S99912S Unspecified injury of left ankle, sequela: Secondary | ICD-10-CM

## 2023-02-23 MED ORDER — MELOXICAM 15 MG PO TABS: 15.0000 mg | ORAL_TABLET | Freq: Every day | ORAL | 2 refills | Status: DC

## 2023-02-23 NOTE — Progress Notes (Signed)
 Subjective: Sherri Arnold is a 53 y.o. female patient who returns to office for follow-up evaluation of left ankle pain.  Patient had injury 04/19/2017 work-related and still has pain and is currently being managed for chronic regional pain syndrome. Has been in the care of Dr. Marylene Arnold in the past.  Relates pain has continued the same.  Relates things have stabilized but still dealing with her chronic pain issues. Relates pain a little worse recently with the cold weather.  Patient denies any other pedal complaints at this time.  Patient Active Problem List   Diagnosis Date Noted   OSA (obstructive sleep apnea) 12/10/2020   Chronic pain syndrome 09/22/2020   Dizziness 09/22/2020   IBS (irritable bowel syndrome) 09/22/2020   Cancer (HCC)    COPD (chronic obstructive pulmonary disease) (HCC)    Cough    GERD (gastroesophageal reflux disease)    Memory loss    Muscle pain    Mixed hyperlipidemia    Muscle spasm of back    Nonscarring hair loss, unspecified    Palpitations    Poor circulation    Stroke (HCC)    Tarsal tunnel syndrome 03/14/2020   Complex regional pain syndrome type II of left lower limb 02/28/2019   Left lower quadrant pain 02/07/2019   Neck mass 12/18/2018   Referred ear pain, right 12/18/2018   Cigarette smoker 08/15/2018   Hoarse 08/15/2018   Sialadenitis 08/15/2018   Submandibular gland hypertrophy 08/15/2018   Vocal cord polyp 08/15/2018   Bilateral carotid artery stenosis 03/24/2017   Left subclavian artery occlusion 03/24/2017   Occlusion of vertebral artery 03/24/2017   Chest pain 07/27/2016   Ventricular premature beats 07/27/2016    Current Outpatient Medications on File Prior to Visit  Medication Sig Dispense Refill   atorvastatin (LIPITOR) 80 MG tablet Take 80 mg by mouth daily.     Budeson-Glycopyrrol-Formoterol (BREZTRI AEROSPHERE) 160-9-4.8 MCG/ACT AERO Inhale 2 puffs into the lungs daily.     Cetirizine-Pseudoephedrine (ZYRTEC-D PO) Take 1  tablet by mouth daily as needed (Congestion and allergies).     Cholecalciferol (D3 ADULT) 25 MCG (1000 UT) CHEW Chew 1 Units by mouth daily.     diclofenac (FLECTOR) 1.3 % PTCH Place 1 patch onto the skin daily. Max of 12 hours 60 patch 1   ibuprofen (ADVIL) 800 MG tablet Take 1 tablet (800 mg total) by mouth every 8 (eight) hours as needed. 30 tablet 0   meloxicam (MOBIC) 15 MG tablet Take 1 tablet by mouth once daily 30 tablet 0   nitroGLYCERIN (NITROSTAT) 0.4 MG SL tablet Place 1 tablet (0.4 mg total) under the tongue every 5 (five) minutes as needed for chest pain. 90 tablet 3   VENTOLIN HFA 108 (90 Base) MCG/ACT inhaler SMARTSIG:1-2 Puff(s) By Mouth Every 6 Hours     Current Facility-Administered Medications on File Prior to Visit  Medication Dose Route Frequency Provider Last Rate Last Admin   ipratropium-albuterol (DUONEB) 0.5-2.5 (3) MG/3ML nebulizer solution 3 mL  3 mL Nebulization Q6H Padgett, Pilar Grammes, MD        Allergies  Allergen Reactions   Ciprofloxacin Hcl Swelling   Propofol Other (See Comments)    Severe Hypotension    Wellbutrin [Bupropion] Other (See Comments)    Hallucinations    Objective:  General: Alert and oriented x3 in no acute distress  Dermatology: No open lesions bilateral lower extremities, no webspace macerations, no ecchymosis bilateral, all nails x 10 are well manicured, minimal callus left  greater than right foot plantar fifth metatarsal head..  Vascular: Dorsalis Pedis and Posterior Tibial pedal pulses faintly palpable, slightly decreased temperature gradient on left, capillary Fill Time 3 seconds,(+) pedal hair growth bilateral, minimal edema left foot and ankle with varicosities and mild purple hue to the left greater than right foot like previous.  Neurology: Michaell Cowing sensation intact via light touch bilateral.  Subjective sharp shooting pains over the deep peroneal and superficial peroneal nerve courses of the left foot and ankle to level of  below the knee with history of sciatica  Musculoskeletal: Unchanged diffuse tenderness to the left foot and ankle.    Strength within normal limits in all groups bilateral except left with guarding due to pain with 4/5 weakness noted.  Gait: Unassisted  Assessment and Plan: Problem List Items Addressed This Visit   None        -Complete examination performed -Re-Discussed with patient her long-term outcome of her chronic pain condition -  Patient has reached maximum medical improvement with no anticipating that patient will ever get better from this chronic condition -[employer] paperwork filled out.  -Refill for meloxicam provided.  -Advised patient to continue with use of walker for stability due to chronic pain issue -Continue with limited activities due to pain condition -I discussed with the patient in depth regarding neurology recommendation for spinal nerve stimulator.  Awaiting appointment in June.   I do believe she may see some benefit from spinal nerve stimulator in regards to her chronic regional pain syndrome of the left lower extremity even if it improves just a little.  Patient to return in 3 months to refill out paperwork.   Louann Sjogren, DPM

## 2023-05-17 ENCOUNTER — Ambulatory Visit (INDEPENDENT_AMBULATORY_CARE_PROVIDER_SITE_OTHER): Payer: Medicare Other | Admitting: Podiatry

## 2023-05-17 ENCOUNTER — Encounter: Payer: Self-pay | Admitting: Podiatry

## 2023-05-17 DIAGNOSIS — G5772 Causalgia of left lower limb: Secondary | ICD-10-CM | POA: Diagnosis not present

## 2023-05-17 NOTE — Progress Notes (Signed)
 Subjective: Sherri Arnold is a 53 y.o. female patient who returns to office for follow-up evaluation of left ankle pain.  Patient had injury 04/19/2017 work-related and still has pain and is currently being managed for chronic regional pain syndrome. Has been in the care of Dr. Lois Arnold in the past.  Relates pain has continued the same.  Relates things have stabilized but still dealing with her chronic pain issues. Relates pain a little worse recently with the cold weather.  Patient denies any other pedal complaints at this time.  Patient Active Problem List   Diagnosis Date Noted   OSA (obstructive sleep apnea) 12/10/2020   Chronic pain syndrome 09/22/2020   Dizziness 09/22/2020   IBS (irritable bowel syndrome) 09/22/2020   Cancer (HCC)    COPD (chronic obstructive pulmonary disease) (HCC)    Cough    GERD (gastroesophageal reflux disease)    Memory loss    Muscle pain    Mixed hyperlipidemia    Muscle spasm of back    Nonscarring hair loss, unspecified    Palpitations    Poor circulation    Stroke (HCC)    Tarsal tunnel syndrome 03/14/2020   Complex regional pain syndrome type II of left lower limb 02/28/2019   Left lower quadrant pain 02/07/2019   Neck mass 12/18/2018   Referred ear pain, right 12/18/2018   Cigarette smoker 08/15/2018   Hoarse 08/15/2018   Sialadenitis 08/15/2018   Submandibular gland hypertrophy 08/15/2018   Vocal cord polyp 08/15/2018   Bilateral carotid artery stenosis 03/24/2017   Left subclavian artery occlusion 03/24/2017   Occlusion of vertebral artery 03/24/2017   Chest pain 07/27/2016   Ventricular premature beats 07/27/2016    Current Outpatient Medications on File Prior to Visit  Medication Sig Dispense Refill   atorvastatin (LIPITOR) 80 MG tablet Take 80 mg by mouth daily.     Budeson-Glycopyrrol-Formoterol (BREZTRI AEROSPHERE) 160-9-4.8 MCG/ACT AERO Inhale 2 puffs into the lungs daily.     Cetirizine-Pseudoephedrine (ZYRTEC-D PO) Take 1  tablet by mouth daily as needed (Congestion and allergies).     Cholecalciferol (D3 ADULT) 25 MCG (1000 UT) CHEW Chew 1 Units by mouth daily.     diclofenac  (FLECTOR ) 1.3 % PTCH Place 1 patch onto the skin daily. Max of 12 hours 60 patch 1   ibuprofen  (ADVIL ) 800 MG tablet Take 1 tablet (800 mg total) by mouth every 8 (eight) hours as needed. 30 tablet 0   meloxicam  (MOBIC ) 15 MG tablet Take 1 tablet (15 mg total) by mouth daily. 30 tablet 2   nitroGLYCERIN  (NITROSTAT ) 0.4 MG SL tablet Place 1 tablet (0.4 mg total) under the tongue every 5 (five) minutes as needed for chest pain. 90 tablet 3   VENTOLIN  HFA 108 (90 Base) MCG/ACT inhaler SMARTSIG:1-2 Puff(s) By Mouth Every 6 Hours     Current Facility-Administered Medications on File Prior to Visit  Medication Dose Route Frequency Provider Last Rate Last Admin   ipratropium-albuterol  (DUONEB) 0.5-2.5 (3) MG/3ML nebulizer solution 3 mL  3 mL Nebulization Q6H Arnold, Sherri Ceo, MD        Allergies  Allergen Reactions   Ciprofloxacin Hcl Swelling   Propofol Other (See Comments)    Severe Hypotension    Wellbutrin [Bupropion] Other (See Comments)    Hallucinations    Objective:  General: Alert and oriented x3 in no acute distress  Dermatology: No open lesions bilateral lower extremities, no webspace macerations, no ecchymosis bilateral, all nails x 10 are well manicured, minimal  callus left greater than right foot plantar fifth metatarsal head..  Vascular: Dorsalis Pedis and Posterior Tibial pedal pulses faintly palpable, slightly decreased temperature gradient on left, capillary Fill Time 3 seconds,(+) pedal hair growth bilateral, minimal edema left foot and ankle with varicosities and mild purple hue to the left greater than right foot like previous.  Neurology: Sherri Arnold sensation intact via light touch bilateral.  Subjective sharp shooting pains over the deep peroneal and superficial peroneal nerve courses of the left foot and ankle to  level of below the knee with history of sciatica  Musculoskeletal: Unchanged diffuse tenderness to the left foot and ankle.    Strength within normal limits in all groups bilateral except left with guarding due to pain with 4/5 weakness noted.  Gait: Unassisted  Assessment and Plan: Problem List Items Addressed This Visit   None        -Complete examination performed -Re-Discussed with patient her long-term outcome of her chronic pain condition -  Patient has reached maximum medical improvement with no anticipating that patient will ever get better from this chronic condition -[employer] paperwork filled out.  -Advised patient to continue with use of walker for stability due to chronic pain issue -Continue with limited activities due to pain condition -I discussed with the patient in depth regarding neurology recommendation for spinal nerve stimulator.  Right now they do not think it would be of benefit.  Patient to return in 3 months to refill out paperwork.   Sherri Arnold, DPM

## 2023-05-20 ENCOUNTER — Other Ambulatory Visit: Payer: Self-pay | Admitting: Podiatry

## 2023-08-17 ENCOUNTER — Ambulatory Visit (INDEPENDENT_AMBULATORY_CARE_PROVIDER_SITE_OTHER): Admitting: Podiatry

## 2023-08-17 ENCOUNTER — Encounter: Payer: Self-pay | Admitting: Podiatry

## 2023-08-17 DIAGNOSIS — G5772 Causalgia of left lower limb: Secondary | ICD-10-CM

## 2023-08-17 NOTE — Progress Notes (Signed)
 Subjective: Sherri Arnold is a 53 y.o. female patient who returns to office for follow-up evaluation of left ankle pain.  Patient had injury 04/19/2017 work-related and still has pain and is currently being managed for chronic regional pain syndrome.   Relates pain has continued the same.  Relates things have stabilized but still dealing with her chronic pain issues. Relates pain a little worse recently with the weather change. Patient denies any other pedal complaints at this time.  Patient Active Problem List   Diagnosis Date Noted   OSA (obstructive sleep apnea) 12/10/2020   Chronic pain syndrome 09/22/2020   Dizziness 09/22/2020   IBS (irritable bowel syndrome) 09/22/2020   Cancer (HCC)    COPD (chronic obstructive pulmonary disease) (HCC)    Cough    GERD (gastroesophageal reflux disease)    Memory loss    Muscle pain    Mixed hyperlipidemia    Muscle spasm of back    Nonscarring hair loss, unspecified    Palpitations    Poor circulation    Stroke (HCC)    Tarsal tunnel syndrome 03/14/2020   Complex regional pain syndrome type II of left lower limb 02/28/2019   Left lower quadrant pain 02/07/2019   Neck mass 12/18/2018   Referred ear pain, right 12/18/2018   Cigarette smoker 08/15/2018   Hoarse 08/15/2018   Sialadenitis 08/15/2018   Submandibular gland hypertrophy 08/15/2018   Vocal cord polyp 08/15/2018   Bilateral carotid artery stenosis 03/24/2017   Left subclavian artery occlusion 03/24/2017   Occlusion of vertebral artery 03/24/2017   Chest pain 07/27/2016   Ventricular premature beats 07/27/2016    Current Outpatient Medications on File Prior to Visit  Medication Sig Dispense Refill   atorvastatin (LIPITOR) 80 MG tablet Take 80 mg by mouth daily.     Budeson-Glycopyrrol-Formoterol (BREZTRI AEROSPHERE) 160-9-4.8 MCG/ACT AERO Inhale 2 puffs into the lungs daily.     Cetirizine-Pseudoephedrine (ZYRTEC-D PO) Take 1 tablet by mouth daily as needed (Congestion and  allergies).     Cholecalciferol (D3 ADULT) 25 MCG (1000 UT) CHEW Chew 1 Units by mouth daily.     diclofenac  (FLECTOR ) 1.3 % PTCH Place 1 patch onto the skin daily. Max of 12 hours 60 patch 1   ibuprofen  (ADVIL ) 800 MG tablet Take 1 tablet (800 mg total) by mouth every 8 (eight) hours as needed. 30 tablet 0   meloxicam  (MOBIC ) 15 MG tablet Take 1 tablet by mouth once daily 90 tablet 0   nitroGLYCERIN  (NITROSTAT ) 0.4 MG SL tablet Place 1 tablet (0.4 mg total) under the tongue every 5 (five) minutes as needed for chest pain. 90 tablet 3   VENTOLIN  HFA 108 (90 Base) MCG/ACT inhaler SMARTSIG:1-2 Puff(s) By Mouth Every 6 Hours     Current Facility-Administered Medications on File Prior to Visit  Medication Dose Route Frequency Provider Last Rate Last Admin   ipratropium-albuterol  (DUONEB) 0.5-2.5 (3) MG/3ML nebulizer solution 3 mL  3 mL Nebulization Q6H Padgett, Danita Macintosh, MD        Allergies  Allergen Reactions   Ciprofloxacin Hcl Swelling   Propofol Other (See Comments)    Severe Hypotension    Wellbutrin [Bupropion] Other (See Comments)    Hallucinations    Objective:  General: Alert and oriented x3 in no acute distress  Dermatology: No open lesions bilateral lower extremities, no webspace macerations, no ecchymosis bilateral, all nails x 10 are well manicured, minimal callus left greater than right foot plantar fifth metatarsal head..  Vascular: Dorsalis  Pedis and Posterior Tibial pedal pulses faintly palpable, slightly decreased temperature gradient on left, capillary Fill Time 3 seconds,(+) pedal hair growth bilateral, minimal edema left foot and ankle with varicosities and mild purple hue to the left greater than right foot like previous.  Neurology: Sheldon sensation intact via light touch bilateral.  Subjective sharp shooting pains over the deep peroneal and superficial peroneal nerve courses of the left foot and ankle to level of below the knee with history of  sciatica  Musculoskeletal: Unchanged diffuse tenderness to the left foot and ankle.    Strength within normal limits in all groups bilateral except left with guarding due to pain with 4/5 weakness noted.  Gait: Unassisted  Assessment and Plan: Problem List Items Addressed This Visit   None        -Complete examination performed -Re-Discussed with patient her long-term outcome of her chronic pain condition -  Patient has reached maximum medical improvement with no anticipating that patient will ever get better from this chronic condition -[employer] paperwork filled out.  -Advised patient to continue with use of walker for stability due to chronic pain issue -Continue with limited activities due to pain condition -I discussed with the patient in depth regarding neurology recommendation for spinal nerve stimulator.  Right now they do not think it would be of benefit.  -Will be on the lookout for any possible treatment options in the future for her.  Patient to return in 3 months to refill out paperwork.   Asberry Failing, DPM

## 2023-08-18 ENCOUNTER — Other Ambulatory Visit: Payer: Self-pay | Admitting: Podiatry

## 2023-08-21 ENCOUNTER — Encounter (HOSPITAL_BASED_OUTPATIENT_CLINIC_OR_DEPARTMENT_OTHER): Payer: Self-pay | Admitting: Emergency Medicine

## 2023-08-21 ENCOUNTER — Ambulatory Visit (HOSPITAL_BASED_OUTPATIENT_CLINIC_OR_DEPARTMENT_OTHER): Admission: EM | Admit: 2023-08-21 | Discharge: 2023-08-21 | Disposition: A | Discharge status: Home / Self Care

## 2023-08-21 DIAGNOSIS — L03313 Cellulitis of chest wall: Secondary | ICD-10-CM

## 2023-08-21 DIAGNOSIS — L282 Other prurigo: Secondary | ICD-10-CM

## 2023-08-21 DIAGNOSIS — L500 Allergic urticaria: Secondary | ICD-10-CM

## 2023-08-21 MED ORDER — TRIAMCINOLONE ACETONIDE 40 MG/ML IJ SUSP
40.0000 mg | Freq: Once | INTRAMUSCULAR | Status: AC
  Administered 2023-08-21: 40 mg via INTRAMUSCULAR

## 2023-08-21 MED ORDER — CETIRIZINE HCL 10 MG PO TABS: 10.0000 mg | ORAL_TABLET | Freq: Every day | ORAL | 0 refills | Status: AC | PRN

## 2023-08-21 MED ORDER — CEPHALEXIN 500 MG PO CAPS: 500.0000 mg | ORAL_CAPSULE | Freq: Three times a day (TID) | ORAL | 0 refills | Status: AC

## 2023-08-21 NOTE — ED Triage Notes (Signed)
 Pt reports she has been bitten in the past by the same type of bug but she is unsure what the bug is but when it bites her she has a red area and in the middle is a tiny spot happened on her chest area. Pt states it happened 3 weeks ago the area was itchy and she scratched it now she has a red rash all over her chest and she thinks the rash is going to her right eye now.

## 2023-08-21 NOTE — ED Provider Notes (Signed)
 [facility]    CSN: [id number] Arrival date & time: 08/21/23  1123      History   Chief Complaint No chief complaint on file.   HPI Sherri Arnold is a 53 y.o. female.   53 year old female who had a bite on her mid right upper chest that she believes was like a bee sting or wasp sting.  This was on approximately 07/26/2023 or earlier.  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  She works to keep her hands clean and to scrub under her nails but sometimes stuff gets under her nails.  She was scratching this area often because it was itching and there were whelps.  Now the rash has spread significantly and looks infected.  There is even some red irritated areas under her eyes.  Her eyes are not matted, not swollen but are watery.     Past Medical History:  Diagnosis Date   A-fib [facility])    Bilateral carotid artery stenosis 03/24/2017   Cancer (HCC)    Chest pain 07/27/2016   Chronic pain syndrome    Cigarette smoker 08/15/2018   Complex regional pain syndrome type II of left lower limb 02/28/2019   COPD (chronic obstructive pulmonary disease) (HCC)    Coronary artery disease    Cough    Dizziness    GERD (gastroesophageal reflux disease)    IBS (irritable bowel syndrome)    Left lower quadrant pain 02/07/2019   Left subclavian artery occlusion 03/24/2017   Memory loss    Mixed hyperlipidemia    Muscle pain    Muscle spasm of back    Neck mass 12/18/2018   Nonscarring hair loss, unspecified    Occlusion of vertebral artery 03/24/2017   Palpitations    Poor circulation    Referred ear pain, right 12/18/2018   Sialadenitis 08/15/2018   Stroke (HCC)    mini-stroke   Submandibular gland hypertrophy 08/15/2018   Tarsal tunnel syndrome 03/14/2020   Ventricular premature beats 07/27/2016   Vocal cord polyp 08/15/2018    Patient Active Problem List   Diagnosis Date Noted   OSA (obstructive sleep apnea) 12/10/2020   Chronic pain  syndrome 09/22/2020   Dizziness 09/22/2020   IBS (irritable bowel syndrome) 09/22/2020   Cancer (HCC)    COPD (chronic obstructive pulmonary disease) (HCC)    Cough    GERD (gastroesophageal reflux disease)    Memory loss    Muscle pain    Mixed hyperlipidemia    Muscle spasm of back    Nonscarring hair loss, unspecified    Palpitations    Poor circulation    Stroke (HCC)    Tarsal tunnel syndrome 03/14/2020   Complex regional pain syndrome type II of left lower limb 02/28/2019   Left lower quadrant pain 02/07/2019   Neck mass 12/18/2018   Referred ear pain, right 12/18/2018   Cigarette smoker 08/15/2018   Hoarse 08/15/2018   Sialadenitis 08/15/2018   Submandibular gland hypertrophy 08/15/2018   Vocal cord polyp 08/15/2018   Bilateral carotid artery stenosis 03/24/2017   Left subclavian artery occlusion 03/24/2017   Occlusion of vertebral artery 03/24/2017   Chest pain 07/27/2016   Ventricular premature beats 07/27/2016    Past Surgical History:  Procedure Laterality Date   EYE SURGERY     NASAL SINUS SURGERY      OB History   No obstetric history on file.      Home Medications  Prior to Admission medications   Medication Sig Start Date End Date Taking? Authorizing Provider  cephALEXin  (KEFLEX ) 500 MG capsule Take 1 capsule (500 mg total) by mouth 3 (three) times daily for 7 days. 08/21/23 08/28/23 Yes Ival Domino, FNP  meloxicam  (MOBIC ) 15 MG tablet Take 1 tablet by mouth once daily 08/18/23  Yes Sikora, Rebecca, DPM  omeprazole (PRILOSEC) 20 MG capsule Take 20 mg by mouth daily.   Yes [provider]  atorvastatin (LIPITOR) 80 MG tablet Take 80 mg by mouth daily.    [provider]  Budeson-Glycopyrrol-Formoterol (BREZTRI AEROSPHERE) 160-9-4.8 MCG/ACT AERO Inhale 2 puffs into the lungs daily.    [provider]  cetirizine  (ZYRTEC ) 10 MG tablet Take 1 tablet (10 mg total) by mouth daily as needed for allergies (itching, rash). 08/21/23  09/20/23  Ival Domino, FNP  Cetirizine -Pseudoephedrine (ZYRTEC -D PO) Take 1 tablet by mouth daily as needed (Congestion and allergies).    [provider]  Cholecalciferol (D3 ADULT) 25 MCG (1000 UT) CHEW Chew 1 Units by mouth daily.    [provider]  diclofenac  (FLECTOR ) 1.3 % PTCH Place 1 patch onto the skin daily. Max of 12 hours 05/20/21   Stover, Titorya, DPM  ibuprofen  (ADVIL ) 800 MG tablet Take 1 tablet (800 mg total) by mouth every 8 (eight) hours as needed. 05/20/21   Stover, Titorya, DPM  nitroGLYCERIN  (NITROSTAT ) 0.4 MG SL tablet Place 1 tablet (0.4 mg total) under the tongue every 5 (five) minutes as needed for chest pain. 06/24/20 08/17/23  Tobb, Kardie, DO  VENTOLIN  HFA 108 (90 Base) MCG/ACT inhaler SMARTSIG:1-2 Puff(s) By Mouth Every 6 Hours 09/19/20   [provider]    Family History Family History  Problem Relation Age of Onset   Heart disease Paternal Grandfather    COPD Mother    Diabetes Maternal Grandmother    Diabetes Paternal Grandmother     Social History Social History   Tobacco Use   Smoking status: Every Day    Current packs/day: 1.00    Average packs/day: 0.3 packs/day for 33.0 years (8.3 ttl pk-yrs)    Types: Cigarettes    Start date: 08/2023   Smokeless tobacco: Never  Vaping Use   Vaping status: Never Used  Substance Use Topics   Alcohol use: No    Comment: Occasional   Drug use: No     Allergies   Ciprofloxacin hcl, Propofol, and Wellbutrin [bupropion]   Review of Systems Review of Systems  Constitutional:  Negative for fever.  Respiratory:  Negative for cough.   Cardiovascular:  Negative for chest pain.  Gastrointestinal:  Negative for abdominal pain, constipation, diarrhea, nausea and vomiting.  Musculoskeletal:  Negative for arthralgias and back pain.  Skin:  Positive for rash (On her chest). Negative for color change.  Neurological:  Negative for syncope.  All other systems reviewed and are  negative.    Physical Exam Triage Vital Signs ED Triage Vitals  Encounter Vitals Group     BP 08/21/23 1138 108/73     Girls Systolic BP Percentile --      Girls Diastolic BP Percentile --      Boys Systolic BP Percentile --      Boys Diastolic BP Percentile --      Pulse Rate 08/21/23 1138 75     Resp 08/21/23 1138 18     Temp 08/21/23 1138 97.8 F (36.6 C)     Temp Source 08/21/23 1138 Oral     SpO2  08/21/23 1138 94 %     Weight --      Height --      Head Circumference --      Peak Flow --      Pain Score 08/21/23 1136 0     Pain Loc --      Pain Education --      Exclude from Growth Chart --    No data found.  Updated Vital Signs BP 108/73 (BP Location: Right Arm)   Pulse 75   Temp 97.8 F (36.6 C) (Oral)   Resp 18   SpO2 94%   Visual Acuity Right Eye Distance:   Left Eye Distance:   Bilateral Distance:    Right Eye Near:   Left Eye Near:    Bilateral Near:     Physical Exam Vitals and nursing note reviewed.  Constitutional:      General: She is not in acute distress.    Appearance: She is well-developed. She is not ill-appearing or toxic-appearing.  HENT:     Head: Normocephalic and atraumatic.     Right Ear: External ear normal.     Left Ear: External ear normal.     Nose: Nose normal.     Mouth/Throat:     Lips: Pink.     Mouth: Mucous membranes are moist.  Eyes:     Conjunctiva/sclera: Conjunctivae normal.     Pupils: Pupils are equal, round, and reactive to light.  Cardiovascular:     Rate and Rhythm: Normal rate and regular rhythm.     Heart sounds: S1 normal and S2 normal. No murmur heard. Pulmonary:     Effort: Pulmonary effort is normal. No respiratory distress.     Breath sounds: Normal breath sounds. No decreased breath sounds, wheezing, rhonchi or rales.  Musculoskeletal:        General: No swelling.  Skin:    General: Skin is warm and dry.     Capillary Refill: Capillary refill takes less than 2 seconds.     Findings: Rash  (Urticarial wheals on cheeks below eyes and all over her anterior chest and lower neck.  There is a secondary erythematous macular rash.  See photos for more information.) present.  Neurological:     Mental Status: She is alert and oriented to person, place, and time.  Psychiatric:        Mood and Affect: Mood normal.         UC Treatments / Results  Labs (all labs ordered are listed, but only abnormal results are displayed) Labs Reviewed - No data to display  EKG   Radiology No results found.  Procedures Procedures (including critical care time)  Medications Ordered in UC Medications  triamcinolone  acetonide (KENALOG -40) injection 40 mg (40 mg Intramuscular Given 08/21/23 1220)    Initial Impression / Assessment and Plan / UC Course  I have reviewed the triage vital signs and the nursing notes.  Pertinent labs & imaging results that were available during my care of the patient were reviewed by me and considered in my medical decision making (see chart for details).  Plan of Care: Urticarial and pruritic rash of chest with secondary cellulitis: Kenalog  40 mg IM now.  Cetirizine  10 mg once or twice daily to help the rash go away and for any itching.  Clean the area with warm soapy fingers, rinse, pat dry and may use moisturizer if needed.  Cephalexin  500 mg 3 times daily for 7 days for the cellulitis.  Follow-up if symptoms do not improve, worsen or new symptoms occur.  I reviewed the plan of care with the patient and/or the patient's guardian.  The patient and/or guardian had time to ask questions and acknowledged that the questions were answered.  I provided instruction on symptoms or reasons to return here or to go to an ER, if symptoms/condition did not improve, worsened or if new symptoms occurred.  Final Clinical Impressions(s) / UC Diagnoses   Final diagnoses:  Allergic urticaria  Cellulitis of chest wall  Pruritic rash     Discharge Instructions       Allergic rash of chest with itching (pruritic rash and urticaria): Kenalog  40 mg injection in the clinic today (this is a steroid injection).  Cetirizine  10 mg once or twice daily if needed for itching and to help the rash resolved.  Secondary cellulitis of chest: There are several areas that look infected on the chest.  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  Cephalexin  500 mg 3 times daily for 7 days.  Get plenty of fluids and rest.  Follow-up if symptoms do not improve, worsen or new symptoms occur.     ED Prescriptions     Medication Sig Dispense Auth. Provider   cephALEXin  (KEFLEX ) 500 MG capsule Take 1 capsule (500 mg total) by mouth 3 (three) times daily for 7 days. 21 capsule Ival Domino, FNP   cetirizine  (ZYRTEC ) 10 MG tablet Take 1 tablet (10 mg total) by mouth daily as needed for allergies (itching, rash). 30 tablet Tationna Fullard, FNP      PDMP not reviewed this encounter.   Ival Domino, FNP 08/21/23 1235

## 2023-08-21 NOTE — Discharge Instructions (Signed)
 Allergic rash of chest with itching (pruritic rash and urticaria): Kenalog  40 mg injection in the clinic today (this is a steroid injection).  Cetirizine  10 mg once or twice daily if needed for itching and to help the rash resolved.  Secondary cellulitis of chest: There are several areas that look infected on the chest.  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  Cephalexin  500 mg 3 times daily for 7 days.  Get plenty of fluids and rest.  Follow-up if symptoms do not improve, worsen or new symptoms occur.

## 2023-11-10 ENCOUNTER — Other Ambulatory Visit: Payer: Self-pay | Admitting: Podiatry

## 2023-11-21 ENCOUNTER — Ambulatory Visit: Admitting: Podiatry

## 2023-11-21 ENCOUNTER — Encounter: Payer: Self-pay | Admitting: Podiatry

## 2023-11-21 DIAGNOSIS — G5772 Causalgia of left lower limb: Secondary | ICD-10-CM

## 2023-11-21 NOTE — Progress Notes (Signed)
 Subjective: Sherri Arnold is a 53 y.o. female patient who returns to office for follow-up evaluation of left ankle pain.  Patient had injury 04/19/2017 work-related and still has pain and is currently being managed for chronic regional pain syndrome.   Relates pain has continued the same.  Relates things have stabilized but still dealing with her chronic pain issues.  Patient denies any other pedal complaints at this time.  Patient Active Problem List   Diagnosis Date Noted   OSA (obstructive sleep apnea) 12/10/2020   Chronic pain syndrome 09/22/2020   Dizziness 09/22/2020   IBS (irritable bowel syndrome) 09/22/2020   Cancer (HCC)    COPD (chronic obstructive pulmonary disease) (HCC)    Cough    GERD (gastroesophageal reflux disease)    Memory loss    Muscle pain    Mixed hyperlipidemia    Muscle spasm of back    Nonscarring hair loss, unspecified    Palpitations    Poor circulation    Stroke (HCC)    Tarsal tunnel syndrome 03/14/2020   Complex regional pain syndrome type II of left lower limb 02/28/2019   Left lower quadrant pain 02/07/2019   Neck mass 12/18/2018   Referred ear pain, right 12/18/2018   Cigarette smoker 08/15/2018   Hoarse 08/15/2018   Sialadenitis 08/15/2018   Submandibular gland hypertrophy 08/15/2018   Vocal cord polyp 08/15/2018   Bilateral carotid artery stenosis 03/24/2017   Left subclavian artery occlusion 03/24/2017   Occlusion of vertebral artery 03/24/2017   Chest pain 07/27/2016   Ventricular premature beats 07/27/2016    Current Outpatient Medications on File Prior to Visit  Medication Sig Dispense Refill   omeprazole (PRILOSEC) 20 MG capsule Take 20 mg by mouth daily.     atorvastatin (LIPITOR) 80 MG tablet Take 80 mg by mouth daily. (Patient not taking: Reported on 11/21/2023)     Budeson-Glycopyrrol-Formoterol (BREZTRI AEROSPHERE) 160-9-4.8 MCG/ACT AERO Inhale 2 puffs into the lungs daily. (Patient not taking: Reported on 11/21/2023)      cetirizine  (ZYRTEC ) 10 MG tablet Take 1 tablet (10 mg total) by mouth daily as needed for allergies (itching, rash). 30 tablet 0   Cetirizine -Pseudoephedrine (ZYRTEC -D PO) Take 1 tablet by mouth daily as needed (Congestion and allergies).     Cholecalciferol (D3 ADULT) 25 MCG (1000 UT) CHEW Chew 1 Units by mouth daily. (Patient not taking: Reported on 11/21/2023)     diclofenac  (FLECTOR ) 1.3 % PTCH Place 1 patch onto the skin daily. Max of 12 hours (Patient not taking: Reported on 11/21/2023) 60 patch 1   ibuprofen  (ADVIL ) 800 MG tablet Take 1 tablet (800 mg total) by mouth every 8 (eight) hours as needed. 30 tablet 0   meloxicam  (MOBIC ) 15 MG tablet Take 1 tablet by mouth once daily 90 tablet 0   nitroGLYCERIN  (NITROSTAT ) 0.4 MG SL tablet Place 1 tablet (0.4 mg total) under the tongue every 5 (five) minutes as needed for chest pain. 90 tablet 3   VENTOLIN  HFA 108 (90 Base) MCG/ACT inhaler SMARTSIG:1-2 Puff(s) By Mouth Every 6 Hours     Current Facility-Administered Medications on File Prior to Visit  Medication Dose Route Frequency Provider Last Rate Last Admin   ipratropium-albuterol  (DUONEB) 0.5-2.5 (3) MG/3ML nebulizer solution 3 mL  3 mL Nebulization Q6H Padgett, Danita Macintosh, MD        Allergies  Allergen Reactions   Ciprofloxacin Hcl Swelling   Propofol Other (See Comments)    Severe Hypotension    Wellbutrin [Bupropion] Other (  See Comments)    Hallucinations    Objective:  General: Alert and oriented x3 in no acute distress  Dermatology: No open lesions bilateral lower extremities, no webspace macerations, no ecchymosis bilateral, all nails x 10 are well manicured, minimal callus left greater than right foot plantar fifth metatarsal head..  Vascular: Dorsalis Pedis and Posterior Tibial pedal pulses faintly palpable, slightly decreased temperature gradient on left, capillary Fill Time 3 seconds,(+) pedal hair growth bilateral, minimal edema left foot and ankle with varicosities  and mild purple hue to the left greater than right foot like previous.  Neurology: Sheldon sensation intact via light touch bilateral.  Subjective sharp shooting pains over the deep peroneal and superficial peroneal nerve courses of the left foot and ankle to level of below the knee with history of sciatica  Musculoskeletal: Unchanged diffuse tenderness to the left foot and ankle.    Strength within normal limits in all groups bilateral except left with guarding due to pain with 4/5 weakness noted.  Gait: Unassisted  Assessment and Plan: Problem List Items Addressed This Visit   None Visit Diagnoses       Complex regional pain syndrome type 2 of left lower extremity    -  Primary            -Complete examination performed -Re-Discussed with patient her long-term outcome of her chronic pain condition -  Patient has reached maximum medical improvement with no anticipating that patient will ever get better from this chronic condition -[employer] paperwork filled out.  -Advised patient to continue with use of walker for stability due to chronic pain issue -Continue with limited activities due to pain condition -Exhausted neurological treatments.  -Will be on the lookout for any possible treatment options in the future for her.  Patient to return in 3 months to refill out paperwork.   Asberry Failing, DPM

## 2024-02-14 ENCOUNTER — Other Ambulatory Visit: Payer: Self-pay | Admitting: Podiatry

## 2024-02-28 ENCOUNTER — Ambulatory Visit: Admitting: Podiatry
